# Patient Record
Sex: Female | Born: 1980
Health system: Southern US, Community
[De-identification: ages and names within clinical notes are randomized; demographics above are authoritative.]

## PROBLEM LIST (undated history)

## (undated) DIAGNOSIS — G4730 Sleep apnea, unspecified: Secondary | ICD-10-CM

## (undated) DIAGNOSIS — M5126 Other intervertebral disc displacement, lumbar region: Secondary | ICD-10-CM

## (undated) DIAGNOSIS — M1990 Unspecified osteoarthritis, unspecified site: Secondary | ICD-10-CM

## (undated) DIAGNOSIS — Z9889 Other specified postprocedural states: Secondary | ICD-10-CM

## (undated) DIAGNOSIS — C539 Malignant neoplasm of cervix uteri, unspecified: Secondary | ICD-10-CM

## (undated) DIAGNOSIS — F32A Depression, unspecified: Secondary | ICD-10-CM

## (undated) DIAGNOSIS — R011 Cardiac murmur, unspecified: Secondary | ICD-10-CM

## (undated) DIAGNOSIS — M5430 Sciatica, unspecified side: Secondary | ICD-10-CM

## (undated) DIAGNOSIS — J45909 Unspecified asthma, uncomplicated: Secondary | ICD-10-CM

## (undated) DIAGNOSIS — T8859XA Other complications of anesthesia, initial encounter: Secondary | ICD-10-CM

## (undated) DIAGNOSIS — C801 Malignant (primary) neoplasm, unspecified: Secondary | ICD-10-CM

## (undated) DIAGNOSIS — K219 Gastro-esophageal reflux disease without esophagitis: Secondary | ICD-10-CM

## (undated) DIAGNOSIS — J189 Pneumonia, unspecified organism: Secondary | ICD-10-CM

## (undated) DIAGNOSIS — E039 Hypothyroidism, unspecified: Secondary | ICD-10-CM

## (undated) DIAGNOSIS — F4310 Post-traumatic stress disorder, unspecified: Secondary | ICD-10-CM

## (undated) DIAGNOSIS — F419 Anxiety disorder, unspecified: Secondary | ICD-10-CM

## (undated) DIAGNOSIS — I959 Hypotension, unspecified: Secondary | ICD-10-CM

## (undated) DIAGNOSIS — E119 Type 2 diabetes mellitus without complications: Secondary | ICD-10-CM

## (undated) DIAGNOSIS — F329 Major depressive disorder, single episode, unspecified: Secondary | ICD-10-CM

## (undated) DIAGNOSIS — E079 Disorder of thyroid, unspecified: Secondary | ICD-10-CM

## (undated) HISTORY — DX: Hypothyroidism, unspecified: E03.9

## (undated) HISTORY — DX: Malignant neoplasm of cervix uteri, unspecified: C53.9

## (undated) HISTORY — PX: APPENDECTOMY: SHX54

## (undated) HISTORY — PX: OTHER SURGICAL HISTORY: SHX169

## (undated) HISTORY — PX: KNEE SURGERY: SHX244

## (undated) HISTORY — PX: CHOLECYSTECTOMY: SHX55

## (undated) HISTORY — PX: BACK SURGERY: SHX140

## (undated) HISTORY — PX: CARPAL TUNNEL RELEASE: SHX101

## (undated) HISTORY — PX: HIP SURGERY: SHX245

## (undated) HISTORY — PX: OOPHORECTOMY: SHX86

## (undated) HISTORY — PX: ABDOMINAL HYSTERECTOMY: SHX81

---

## 2017-02-17 ENCOUNTER — Other Ambulatory Visit: Payer: Self-pay | Admitting: Orthopedic Surgery

## 2017-02-17 DIAGNOSIS — M545 Low back pain: Principal | ICD-10-CM

## 2017-02-17 DIAGNOSIS — M79605 Pain in left leg: Secondary | ICD-10-CM

## 2017-02-17 DIAGNOSIS — G8929 Other chronic pain: Secondary | ICD-10-CM

## 2017-03-09 ENCOUNTER — Ambulatory Visit
Admission: RE | Admit: 2017-03-09 | Discharge: 2017-03-09 | Disposition: A | Discharge status: Home / Self Care | Payer: Worker's Compensation | Source: Ambulatory Visit | Attending: Orthopedic Surgery | Admitting: Orthopedic Surgery

## 2017-03-09 DIAGNOSIS — G8929 Other chronic pain: Secondary | ICD-10-CM

## 2017-03-09 DIAGNOSIS — M79605 Pain in left leg: Secondary | ICD-10-CM

## 2017-03-09 DIAGNOSIS — M545 Low back pain: Principal | ICD-10-CM

## 2017-04-15 ENCOUNTER — Encounter (HOSPITAL_COMMUNITY): Payer: Self-pay

## 2017-04-15 ENCOUNTER — Inpatient Hospital Stay (HOSPITAL_COMMUNITY)
Admission: AD | Admit: 2017-04-15 | Discharge: 2017-04-15 | Disposition: A | Discharge status: Home / Self Care | Payer: Federal, State, Local not specified - PPO | Source: Ambulatory Visit | Attending: Obstetrics & Gynecology | Admitting: Obstetrics & Gynecology

## 2017-04-15 DIAGNOSIS — J069 Acute upper respiratory infection, unspecified: Secondary | ICD-10-CM | POA: Diagnosis not present

## 2017-04-15 DIAGNOSIS — Z3202 Encounter for pregnancy test, result negative: Secondary | ICD-10-CM | POA: Insufficient documentation

## 2017-04-15 DIAGNOSIS — R21 Rash and other nonspecific skin eruption: Secondary | ICD-10-CM | POA: Diagnosis present

## 2017-04-15 DIAGNOSIS — J029 Acute pharyngitis, unspecified: Secondary | ICD-10-CM | POA: Diagnosis present

## 2017-04-15 HISTORY — DX: Anxiety disorder, unspecified: F41.9

## 2017-04-15 HISTORY — DX: Depression, unspecified: F32.A

## 2017-04-15 HISTORY — DX: Major depressive disorder, single episode, unspecified: F32.9

## 2017-04-15 HISTORY — DX: Other intervertebral disc displacement, lumbar region: M51.26

## 2017-04-15 HISTORY — DX: Post-traumatic stress disorder, unspecified: F43.10

## 2017-04-15 HISTORY — DX: Other specified postprocedural states: Z98.890

## 2017-04-15 HISTORY — DX: Unspecified asthma, uncomplicated: J45.909

## 2017-04-15 HISTORY — DX: Sleep apnea, unspecified: G47.30

## 2017-04-15 HISTORY — DX: Gastro-esophageal reflux disease without esophagitis: K21.9

## 2017-04-15 HISTORY — DX: Unspecified osteoarthritis, unspecified site: M19.90

## 2017-04-15 HISTORY — DX: Hypotension, unspecified: I95.9

## 2017-04-15 HISTORY — DX: Sciatica, unspecified side: M54.30

## 2017-04-15 LAB — URINALYSIS, ROUTINE W REFLEX MICROSCOPIC
Bacteria, UA: NONE SEEN
Glucose, UA: NEGATIVE mg/dL
Ketones, ur: 5 mg/dL — AB
Leukocytes, UA: NEGATIVE
Nitrite: NEGATIVE
Protein, ur: NEGATIVE mg/dL
Specific Gravity, Urine: 1.027 (ref 1.005–1.030)
pH: 5 (ref 5.0–8.0)

## 2017-04-15 LAB — POCT PREGNANCY, URINE: Preg Test, Ur: NEGATIVE

## 2017-04-15 LAB — INFLUENZA PANEL BY PCR (TYPE A & B)
Influenza A By PCR: NEGATIVE
Influenza B By PCR: NEGATIVE

## 2017-04-15 MED ORDER — AZITHROMYCIN 250 MG PO TABS: ORAL_TABLET | ORAL | 0 refills | Status: DC

## 2017-04-15 MED ORDER — HYDROCOD POLST-CPM POLST ER 10-8 MG/5ML PO SUER: 5.0000 mL | Freq: Two times a day (BID) | ORAL | 0 refills | Status: DC

## 2017-04-15 NOTE — Discharge Instructions (Signed)
In late 2019, the [facility] will be [facility] to the [facility]. At that time, the MAU (Maternity Admissions Unit), where you are being seen today, will no longer take care of non-pregnant patients. We strongly encourage you to find a doctor's office before that time, so that you can be seen with any GYN concerns, like vaginal discharge, urinary tract infection, etc.. in a timely manner. ° °In order to make an office visit more convenient, the [facility] will be offering evening hours with same-day appointments, walk-in appointments and scheduled appointments available during this time. ° °[facility] Hours: °Monday - 8am - 7:30 pm with walk-in between 4pm- 7:30 pm °Tuesday - 8 am - 5 pm (starting 05/17/17 we will be open late and accepting walk-ins from 4pm - 7:30pm) °Wednesday - 8 am - 5 pm (starting 08/17/17 we will be open late and accepting walk-ins from 4pm - 7:30pm) °Thursday 8 am - 5 pm (starting 11/17/17 we will be open late and accepting walk-ins from 4pm - 7:30pm) °Friday 8 am - 5 pm ° °For an appointment please call the [facility] at [phone] ° °For urgent needs, [facility] is also available for management of urgent GYN complaints such as vaginal discharge or urinary tract infections. ° ° ° ° ° °

## 2017-04-15 NOTE — MAU Note (Addendum)
Patient presents with fever last night of 104, cough, congestion, sore throat, shortness of breath.   Took Tylenol and Nyquil around 11 pm, uses a CPAP machine.

## 2017-04-15 NOTE — MAU Provider Note (Signed)
History     CSN: [id number]  Arrival date and time: 04/15/17 2725   First Provider Initiated Contact with Patient 04/15/17 [phone]      Chief Complaint  Patient presents with  . Fever  . Cough  . Sore Throat   HPI  Ms.  Frances Davis is a 37 y.o. year old [id number] non-pregnant female who presents to MAU reporting fever of 104 degree last night, cough, congestion, sore throat, and SOB. She took Tylenol and Nyquil around 11 pm, she uses a CPAP machine. She came to MAU, because "she is new to the area and didn't know where to go."   Past Medical History:  Diagnosis Date  . Anxiety   . Arthritis   . Asthma   . Depression   . GERD (gastroesophageal reflux disease)   . History of carpal tunnel surgery of right wrist   . History of carpal tunnel surgery of right wrist   . Low blood pressure   . Lumbar herniated disc   . PTSD (post-traumatic stress disorder)   . Sciatica   . Sleep apnea     Past Surgical History:  Procedure Laterality Date  . ABDOMINAL HYSTERECTOMY    . APPENDECTOMY    . cholesectomy    . KNEE SURGERY    . OOPHORECTOMY      History reviewed. No pertinent family history.  Social History   Tobacco Use  . Smoking status: Never Smoker  . Smokeless tobacco: Never Used  Substance Use Topics  . Alcohol use: No    Frequency: Never  . Drug use: No    Allergies: Not on File  No medications prior to admission.    Review of Systems  Constitutional: Negative.  Negative for fever (temp of 104 last night).  HENT: Positive for congestion and sore throat.   Eyes: Negative.   Respiratory: Positive for cough and shortness of breath.   Cardiovascular: Negative.   Gastrointestinal: Negative.   Endocrine: Negative.   Genitourinary: Negative.   Musculoskeletal: Negative.   Skin: Negative.   Allergic/Immunologic: Negative.   Neurological: Negative.   Hematological: Negative.   Psychiatric/Behavioral: Negative.    Physical Exam   Blood pressure 128/68,  pulse 94, temperature 98.4 F (36.9 C), resp. rate 18, height 5' (1.524 m), weight 196 lb (88.9 kg), SpO2 99 %.  Physical Exam  Nursing note and vitals reviewed. Constitutional: She is oriented to person, place, and time. She appears well-developed and well-nourished.  HENT:  Head: Normocephalic and atraumatic.  Eyes: Pupils are equal, round, and reactive to light.  Neck: Normal range of motion.  Cardiovascular: Normal rate, regular rhythm and normal heart sounds.  Respiratory: Effort normal and breath sounds normal.  GI: Soft. Bowel sounds are normal.  Genitourinary:  Genitourinary Comments: Pelvic not indicated  Musculoskeletal: Normal range of motion.  Neurological: She is alert and oriented to person, place, and time.  Skin: Skin is warm and dry. Rash (in groin) noted.  Psychiatric: She has a normal mood and affect. Her behavior is normal. Thought content normal.    MAU Course  Procedures  MDM CCUA Flu Swab Results for orders placed or performed during the hospital encounter of 04/15/17 (from the past 24 hour(s))  Urinalysis, Routine w reflex microscopic     Status: Abnormal   Collection Time: 04/15/17  8:10 AM  Result Value Ref Range   Color, Urine YELLOW YELLOW   APPearance HAZY (A) CLEAR   Specific Gravity, Urine 1.027 1.005 - 1.030  pH 5.0 5.0 - 8.0   Glucose, UA NEGATIVE NEGATIVE mg/dL   Hgb urine dipstick SMALL (A) NEGATIVE   Bilirubin Urine SMALL (A) NEGATIVE   Ketones, ur 5 (A) NEGATIVE mg/dL   Protein, ur NEGATIVE NEGATIVE mg/dL   Nitrite NEGATIVE NEGATIVE   Leukocytes, UA NEGATIVE NEGATIVE   RBC / HPF 0-5 0 - 5 RBC/hpf   WBC, UA 0-5 0 - 5 WBC/hpf   Bacteria, UA NONE SEEN NONE SEEN   Squamous Epithelial / LPF 0-5 (A) NONE SEEN   Mucus PRESENT   Pregnancy, urine POC     Status: None   Collection Time: 04/15/17  8:18 AM  Result Value Ref Range   Preg Test, Ur NEGATIVE NEGATIVE  Influenza panel by PCR (type A & B)     Status: None   Collection Time:  04/15/17  8:25 AM  Result Value Ref Range   Influenza A By PCR NEGATIVE NEGATIVE   Influenza B By PCR NEGATIVE NEGATIVE     Assessment and Plan  Upper respiratory tract infection, unspecified type  - Rx for Tussionex BID and Azithromax pak - Information provided on cough & URI - Discharge patient - Patient verbalized an understanding of the plan of care and agrees.     Laury Deep, MSN, CNM 04/15/2017, 8:43 AM

## 2018-02-11 ENCOUNTER — Emergency Department (HOSPITAL_COMMUNITY)
Admission: EM | Admit: 2018-02-11 | Discharge: 2018-02-12 | Disposition: A | Discharge status: Home / Self Care | Payer: Federal, State, Local not specified - PPO | Attending: Emergency Medicine | Admitting: Emergency Medicine

## 2018-02-11 ENCOUNTER — Emergency Department (HOSPITAL_COMMUNITY): Payer: Federal, State, Local not specified - PPO

## 2018-02-11 ENCOUNTER — Other Ambulatory Visit: Payer: Self-pay

## 2018-02-11 DIAGNOSIS — Z79899 Other long term (current) drug therapy: Secondary | ICD-10-CM | POA: Diagnosis not present

## 2018-02-11 DIAGNOSIS — R072 Precordial pain: Secondary | ICD-10-CM | POA: Diagnosis not present

## 2018-02-11 DIAGNOSIS — R079 Chest pain, unspecified: Secondary | ICD-10-CM | POA: Diagnosis present

## 2018-02-11 DIAGNOSIS — J45909 Unspecified asthma, uncomplicated: Secondary | ICD-10-CM | POA: Insufficient documentation

## 2018-02-11 LAB — CBC
HCT: 38.2 % (ref 36.0–46.0)
Hemoglobin: 12.8 g/dL (ref 12.0–15.0)
MCH: 30 pg (ref 26.0–34.0)
MCHC: 33.5 g/dL (ref 30.0–36.0)
MCV: 89.7 fL (ref 80.0–100.0)
Platelets: 292 10*3/uL (ref 150–400)
RBC: 4.26 MIL/uL (ref 3.87–5.11)
RDW: 12.2 % (ref 11.5–15.5)
WBC: 10 10*3/uL (ref 4.0–10.5)
nRBC: 0 % (ref 0.0–0.2)

## 2018-02-11 LAB — BASIC METABOLIC PANEL
Anion gap: 10 (ref 5–15)
BUN: 22 mg/dL — ABNORMAL HIGH (ref 6–20)
CO2: 25 mmol/L (ref 22–32)
Calcium: 8.7 mg/dL — ABNORMAL LOW (ref 8.9–10.3)
Chloride: 104 mmol/L (ref 98–111)
Creatinine, Ser: 0.8 mg/dL (ref 0.44–1.00)
GFR calc Af Amer: >60 mL/min (ref >60)
GFR calc non Af Amer: >60 mL/min (ref >60)
Glucose, Bld: 122 mg/dL — ABNORMAL HIGH (ref 70–99)
Potassium: 3.8 mmol/L (ref 3.5–5.1)
Sodium: 139 mmol/L (ref 135–145)

## 2018-02-11 LAB — I-STAT TROPONIN, ED
Troponin i, poc: 0 ng/mL (ref 0.00–0.08)
Troponin i, poc: 0 ng/mL (ref 0.00–0.08)

## 2018-02-11 MED ORDER — ALUM & MAG HYDROXIDE-SIMETH 200-200-20 MG/5ML PO SUSP
30.0000 mL | Freq: Once | ORAL | Status: AC
  Administered 2018-02-11: 30 mL via ORAL
  Filled 2018-02-11: qty 30

## 2018-02-11 MED ORDER — ACETAMINOPHEN 325 MG PO TABS
650.0000 mg | ORAL_TABLET | Freq: Once | ORAL | Status: AC
  Administered 2018-02-11: 650 mg via ORAL
  Filled 2018-02-11: qty 2

## 2018-02-11 MED ORDER — IBUPROFEN 200 MG PO TABS
600.0000 mg | ORAL_TABLET | Freq: Once | ORAL | Status: AC
  Administered 2018-02-11: 600 mg via ORAL
  Filled 2018-02-11: qty 1

## 2018-02-11 NOTE — ED Triage Notes (Signed)
Patient c/o CP that began this a.m. Took 324mg  asa. Also c/o SOB. C/o pain with inspiration.

## 2018-02-12 NOTE — ED Notes (Signed)
PT states understanding of care given, follow up care. PT ambulated from ED to car with a steady gait.  

## 2018-02-12 NOTE — ED Provider Notes (Signed)
[facility] EMERGENCY DEPARTMENT Provider Note   CSN: [id number] Arrival date & time: 02/11/18  1921     History   Chief Complaint Chief Complaint  Patient presents with  . Chest Pain    HPI Frances Davis is a 37 y.o. female.  HPI Patient is a 37 year old female with a family history of early cardiac disease who presents to the emergency department with complaints of anterior chest discomfort and posterior shoulder discomfort with some left arm discomfort that began around 1 PM today.  She took 2 aspirin.  She reported mild shortness of breath.  She has some pain with inspiration.  She denies tobacco abuse.  No history of hypertension, hyperlipidemia, diabetes.  She reports that her mother had MI x3 in her 61s.  Her brother has had an MI as well in his late 76s.  She has never had a cardiac work-up to this point.  Denies unilateral leg swelling.  Denies orthopnea.  Symptoms occurred while she was standing in the kitchen and cooking.  Her symptoms have been rather constant since 1 PM.  She does have some pleuritic posterior left shoulder and scapular discomfort.   Past Medical History:  Diagnosis Date  . Anxiety   . Arthritis   . Asthma   . Depression   . GERD (gastroesophageal reflux disease)   . History of carpal tunnel surgery of right wrist   . History of carpal tunnel surgery of right wrist   . Low blood pressure   . Lumbar herniated disc   . PTSD (post-traumatic stress disorder)   . Sciatica   . Sleep apnea     Patient Active Problem List   Diagnosis Date Noted  . Upper respiratory infection 04/15/2017  . Rash of genital area 04/15/2017    Past Surgical History:  Procedure Laterality Date  . ABDOMINAL HYSTERECTOMY    . APPENDECTOMY    . cholesectomy    . KNEE SURGERY    . OOPHORECTOMY       OB History    Gravida  2   Para  2   Term  2   Preterm      AB      Living  2     SAB      TAB      Ectopic      Multiple      Live Births  2            Home Medications    Prior to Admission medications   Medication Sig Start Date End Date Taking? Authorizing Provider  Calcium Carb-Cholecalciferol (CALCIUM 500 +D PO) Take 1 tablet by mouth daily.   Yes [provider]  celecoxib (CELEBREX) 200 MG capsule Take 200 mg by mouth daily.   Yes [provider]  diazepam (VALIUM) 5 MG tablet Take 5 mg by mouth 2 (two) times daily as needed for anxiety.   Yes [provider]  divalproex (DEPAKOTE) 500 MG DR tablet Take 500 mg by mouth daily.   Yes [provider]  esomeprazole (NEXIUM) 40 MG capsule Take 40 mg by mouth daily at 12 noon.   Yes [provider]  estradiol (ESTRACE) 1 MG tablet Take 1 mg by mouth daily.   Yes [provider]  hydrOXYzine (ATARAX/VISTARIL) 25 MG tablet Take 25 mg by mouth daily.   Yes [provider]  methocarbamol (ROBAXIN) 750 MG tablet Take 750 mg by mouth 2 (two) times daily.  Yes [provider]  Multiple Vitamin (MULTIVITAMIN WITH MINERALS) TABS tablet Take 1 tablet by mouth daily. Centrum   Yes [provider]  QUEtiapine (SEROQUEL) 100 MG tablet Take 100 mg by mouth at bedtime.   Yes [provider]  sertraline (ZOLOFT) 100 MG tablet Take 100 mg by mouth daily.   Yes [provider]  valACYclovir (VALTREX) 500 MG tablet Take 500 mg by mouth daily.   Yes [provider]    Family History No family history on file.  Social History Social History   Tobacco Use  . Smoking status: Never Smoker  . Smokeless tobacco: Never Used  Substance Use Topics  . Alcohol use: No    Frequency: Never  . Drug use: No     Allergies   Shellfish allergy and Lidocaine   Review of Systems Review of Systems  All other systems reviewed and are negative.    Physical Exam Updated Vital Signs BP 125/79   Pulse 83   Temp 98.2 F (36.8 C) (Oral)   Resp 20   Ht 5' (1.524 m)   Wt  89.8 kg   SpO2 99%   BMI 38.67 kg/m   Physical Exam Vitals signs and nursing note reviewed.  Constitutional:      General: She is not in acute distress.    Appearance: She is well-developed.  HENT:     Head: Normocephalic and atraumatic.  Neck:     Musculoskeletal: Normal range of motion.  Cardiovascular:     Rate and Rhythm: Normal rate and regular rhythm.     Heart sounds: Normal heart sounds.  Pulmonary:     Effort: Pulmonary effort is normal.     Breath sounds: Normal breath sounds.  Abdominal:     General: There is no distension.     Palpations: Abdomen is soft.     Tenderness: There is no abdominal tenderness.  Musculoskeletal: Normal range of motion.  Skin:    General: Skin is warm and dry.  Neurological:     Mental Status: She is alert and oriented to person, place, and time.  Psychiatric:        Judgment: Judgment normal.      ED Treatments / Results  Labs (all labs ordered are listed, but only abnormal results are displayed) Labs Reviewed  BASIC METABOLIC PANEL - Abnormal; Notable for the following components:      Result Value   Glucose, Bld 122 (*)    BUN 22 (*)    Calcium 8.7 (*)    All other components within normal limits  CBC  I-STAT TROPONIN, ED  I-STAT TROPONIN, ED    EKG EKG Interpretation  Date/Time:  Saturday February 11 2018 22:57:44 EST Ventricular Rate:  80 PR Interval:    QRS Duration: 89 QT Interval:  369 QTC Calculation: 426 R Axis:   74 Text Interpretation:  Sinus rhythm No significant change was found Confirmed by Jola Schmidt [phone]) on 02/12/2018 12:05:43 AM   Radiology Dg Chest 2 View  Result Date: 02/11/2018 CLINICAL DATA:  Chest pain starting this morning EXAM: CHEST - 2 VIEW COMPARISON:  None. FINDINGS: The heart size and mediastinal contours are within normal limits. Both lungs are clear. The visualized skeletal structures are unremarkable. Surgical clips are identified in the right upper quadrant. IMPRESSION: No  active cardiopulmonary disease. Electronically Signed   By: Abelardo Diesel M.D.   On: 02/11/2018 20:20    Procedures Procedures (including critical care time)  Medications  Ordered in ED Medications  ibuprofen (ADVIL,MOTRIN) tablet 600 mg (600 mg Oral Given 02/11/18 2258)  acetaminophen (TYLENOL) tablet 650 mg (650 mg Oral Given 02/11/18 2258)  alum & mag hydroxide-simeth (MAALOX/MYLANTA) 200-200-20 MG/5ML suspension 30 mL (30 mLs Oral Given 02/11/18 2259)     Initial Impression / Assessment and Plan / ED Course  I have reviewed the triage vital signs and the nursing notes.  Pertinent labs & imaging results that were available during my care of the patient were reviewed by me and considered in my medical decision making (see chart for details).     Vital signs are stable.  Low suspicion for ACS.  EKG x2 and troponin x2-.  Given her strong family history of early cardiac disease she will need outpatient cardiology follow-up.  Doubt dissection.  Doubt PE.  Patient is PERC negative.  No tachycardia or hypoxia.  Likely pleurisy.  Patient encouraged to return to the ER for new or worsening symptoms  Final Clinical Impressions(s) / ED Diagnoses   Final diagnoses:  Precordial chest pain    ED Discharge Orders    None       Jola Schmidt, MD 02/12/18 [phone]

## 2018-03-10 ENCOUNTER — Other Ambulatory Visit: Payer: Self-pay | Admitting: Surgery

## 2018-03-10 ENCOUNTER — Emergency Department (HOSPITAL_COMMUNITY)
Admission: EM | Admit: 2018-03-10 | Discharge: 2018-03-10 | Disposition: A | Discharge status: Home / Self Care | Payer: Federal, State, Local not specified - PPO | Attending: Emergency Medicine | Admitting: Emergency Medicine

## 2018-03-10 ENCOUNTER — Other Ambulatory Visit: Payer: Self-pay

## 2018-03-10 ENCOUNTER — Encounter (HOSPITAL_COMMUNITY): Payer: Self-pay

## 2018-03-10 DIAGNOSIS — R05 Cough: Secondary | ICD-10-CM | POA: Diagnosis present

## 2018-03-10 DIAGNOSIS — J069 Acute upper respiratory infection, unspecified: Secondary | ICD-10-CM

## 2018-03-10 DIAGNOSIS — J45909 Unspecified asthma, uncomplicated: Secondary | ICD-10-CM | POA: Diagnosis not present

## 2018-03-10 DIAGNOSIS — Z79899 Other long term (current) drug therapy: Secondary | ICD-10-CM | POA: Diagnosis not present

## 2018-03-10 DIAGNOSIS — Z9049 Acquired absence of other specified parts of digestive tract: Secondary | ICD-10-CM | POA: Diagnosis not present

## 2018-03-10 DIAGNOSIS — F419 Anxiety disorder, unspecified: Secondary | ICD-10-CM | POA: Insufficient documentation

## 2018-03-10 DIAGNOSIS — B9789 Other viral agents as the cause of diseases classified elsewhere: Secondary | ICD-10-CM

## 2018-03-10 DIAGNOSIS — F329 Major depressive disorder, single episode, unspecified: Secondary | ICD-10-CM | POA: Diagnosis not present

## 2018-03-10 LAB — INFLUENZA PANEL BY PCR (TYPE A & B)
Influenza A By PCR: NEGATIVE
Influenza B By PCR: NEGATIVE

## 2018-03-10 MED ORDER — BENZONATATE 100 MG PO CAPS: 100.0000 mg | ORAL_CAPSULE | Freq: Three times a day (TID) | ORAL | 0 refills | Status: DC

## 2018-03-10 MED ORDER — ACETAMINOPHEN 325 MG PO TABS: 650.0000 mg | ORAL_TABLET | Freq: Once | ORAL | Status: DC

## 2018-03-10 MED ORDER — IBUPROFEN 400 MG PO TABS
600.0000 mg | ORAL_TABLET | Freq: Once | ORAL | Status: DC
  Administered 2018-03-10: 600 mg via ORAL
  Filled 2018-03-10: qty 1

## 2018-03-10 MED ORDER — ALBUTEROL SULFATE HFA 108 (90 BASE) MCG/ACT IN AERS: 1.0000 | INHALATION_SPRAY | Freq: Four times a day (QID) | RESPIRATORY_TRACT | 0 refills | Status: DC | PRN

## 2018-03-10 NOTE — ED Notes (Signed)
Patient verbalizes understanding of discharge instructions. Opportunity for questioning and answers were provided. Armband removed by staff, pt discharged from ED.  

## 2018-03-10 NOTE — ED Triage Notes (Signed)
Pt presents for eval of cough, fever, and generalized body aches x2 days. Pt states [place] and was likely around several sick people. Pt states her temp at home has been above 100.26F, endorses wheezing. Pt states she has not taken any medication for her symptoms.

## 2018-03-10 NOTE — Discharge Instructions (Addendum)
At this time you appear to have a viral upper respiratory infection.  Treatment of symptoms is the best plan.  This includes tylenol or motrin for aches and pains, gargling with salt water for sore throat, increased fluid intake, especially hot drinks to soothe the throat.  Over the counter medications that include decongestants and cough suppressants may also help.  Expect to have symptoms for 5-10 days.  Return to the ER for worsening condition or new concerning symptoms.  1. Medications: tessalon for cough, ibuprofen/tylenol for body aches, albuterol for wheezing, usual home medications 2. Treatment: rest, drink plenty of fluids, take tylenol or ibuprofen for fever control 3. Follow Up: Please follow up with your primary doctor in 3 days for discussion of your diagnoses and further evaluation after today's visit; if you do not have a primary care doctor use the resource guide provided to find one; Return to the ER for high fevers, difficulty breathing or other concerning symptoms  Continue to stay well-hydrated. Gargle warm salt water and spit it out for sore throat. Take benadryl or other antihistamine to decrease secretions and for watery itchy eyes. Continued to alternate between Tylenol and ibuprofen for pain. Follow up with your primary care doctor in 5-7 days or referral for urgent care for recheck of ongoing symptoms however return to emergency department for emergent changing or worsening of symptoms.   Read the instructions below on reasons to return to the emergency department and to learn more about your diagnosis.  Use over the counter medications for symptomatic relief as we discussed (mucinex as a decongestant, Tylenol for fever/pain, Motrin/Ibuprofen for muscle aches). If prescribed a cough suppressant during your visit, do not operate heavy machinery with in 5 hours of taking this medication. Follow up with your primary care doctor in 4 days if your symptoms persist.  Your more than  welcome to return to the emergency department if symptoms worsen or become concerning.  Upper Respiratory Infection, Adult  An upper respiratory infection (URI) is also sometimes known as the common cold. Most people improve within 1 week, but symptoms can last up to 2 weeks. A residual cough may last even longer.   URI is most commonly caused by a virus. Viruses are NOT treated with antibiotics. You can easily spread the virus to others by oral contact. This includes kissing, sharing a glass, coughing, or sneezing. Touching your mouth or nose and then touching a surface, which is then touched by another person, can also spread the virus.   TREATMENT  Treatment is directed at relieving symptoms. There is no cure. Antibiotics are not effective, because the infection is caused by a virus, not by bacteria. Treatment may include:  Increased fluid intake. Sports drinks offer valuable electrolytes, sugars, and fluids.  Breathing heated mist or steam (vaporizer or shower).  Eating chicken soup or other clear broths, and maintaining good nutrition.  Getting plenty of rest.  Using gargles or lozenges for comfort.  Controlling fevers with ibuprofen or acetaminophen as directed by your caregiver.  Increasing usage of your inhaler if you have asthma.  Return to work when your temperature has returned to normal.   SEEK MEDICAL CARE IF:  After the first few days, you feel you are getting worse rather than better.  You develop worsening shortness of breath, or brown or red sputum. These may be signs of pneumonia.  You develop yellow or brown nasal discharge or pain in the face, especially when you bend forward. These may be  signs of sinusitis.  You develop a fever, swollen neck glands, pain with swallowing, or white areas in the back of your throat. These may be signs of strep throat.  Severe headache or stiff neck. Uncontrolled vomiting. Lightheadedness, feeling faint, or if you pass out. Problems  breathing or shortness of breath. Weakness or inability to walk. Any other concerning symptoms or concerns. If not improving over 7 days or if feeling worse at any time.  RESOURCE GUIDE  Dental Problems  Patients with Medicaid: [employer] Phone:  [phone]                                                                             Phone:  [phone]  If unable to pay or uninsured, contact:  [facility] or [facility]. to become qualified for the adult dental clinic.  Chronic Pain Problems Contact [facility]  [phone] Patients need to be referred by their primary care doctor.  Insufficient Money for Medicine Contact [employer]:  call "211" or [place] [phone].  No Primary Care Doctor Call [facility]  [phone] Other agencies that provide inexpensive medical care    [place]  [phone]    [facility] Internal [facility]  [place]  [phone]    [facility]  [phone]    [facility]  [place]  [phone] [facility]  [phone] [place]   [phone] (emergency services [phone])  Abuse/Neglect [place] [phone] [place] [phone] (After Hours)  Emergency [place] [phone]  [place] [facility] [place] [phone] [place] [phone]  MRSA Hotline #:   671-779-9524  [facility] Dept. [address]          [place]  [place] Phone:  [phone]                                     Phone:  [phone]                   Phone:  [place] Phone:  [place] [phone] (After Hours)   If you develop symptoms of Shortness of Breath, Chest Pain, Swelling of lips, mouth or tongue or if your condition becomes worse with any new symptoms, see your doctor or return to the Emergency Department for immediate care. Emergency services are not intended to be a substitute for comprehensive medical attention.  Please contact your doctor for follow up if not improving as expected.   Call your doctor in 5-7 days or as directed if there is no improvement.   Community Resources: *IF YOU ARE IN IMMEDIATE DANGER CALL 911!  Abuse/Neglect:  Family Services Crisis Hotline [facility]): ([phone] Center Against Violence [facility]): [phone]  After hours, holidays and weekends: 347-540-4691 National Domestic Violence Hotline: [place]: [place]: Dannielle Karvonen: 7075134085  Health Clinics:  Urgent [place] ([place]): [phone] Monday - Friday 8 AM - 9 PM, Saturday and Sunday 10 AM - 9 PM  [facility] [place]: [phone] Monday - Friday 8 AM - 5 PM  [facility]  [address]: [phone] Monday- Friday 8:30 AM - 5:30 PM, Sat 9 AM - 1 PM  24 HR [place] [facility] [address]: [phone] [facility] [address]: [phone] [facility] [address]: [phone]  24 [facility] [place] [facility]: [address] [phone]  Cultures: If culture results are positive, we will notify you if a change in treatment is necessary.  LABORATORY TESTS:         If  you had any labs drawn in the ED that have not resulted by the time you are discharged home, we will review these lab results and the treatment given to you.  If there is any further treatment or notification needed, we will contact you by phone, or letter.  "PLEASE ENSURE THAT YOU HAVE GIVEN US YOUR CURRENT WORKING PHONE NUMBER AND YOUR CURRENT ADDRESS, so that we can contact you if needed."  RADIOLOGY TESTS:  If the referred physician wants todays x-rays, please call the hospitals Radiology Department the day before your doctors appointment. [place]     [phone] [place]   [phone] [place]     [phone]  Our doctors and staff appreciate your choosing [place] for your emergency medical care needs. We are here to serve you.

## 2018-03-10 NOTE — ED Provider Notes (Signed)
[facility] Provider Note   CSN: [id number] Arrival date & time: 03/10/18  1138     History   Chief Complaint Chief Complaint  Patient presents with  . Cough  . Fever    HPI Frances Davis is a 38 y.o. female with a PMH of asthma presenting with a productive cough, fever, body aches, and congestion onset 2 days ago. Patient reports she went to [employer] and has had multiple sick contacts. Patient states she has taken Nyquil without relief. Patient reports associated sore throat, frontal headaches, and wheezing. Patient reports shortness of breath with coughing at times. Patient states she had a fever yesterday of 100F at home. Patient denies nausea, vomiting, diarrhea, abdominal pain, chest pain, weakness, vision changes, dizziness, or syncope.   HPI  Past Medical History:  Diagnosis Date  . Anxiety   . Arthritis   . Asthma   . Depression   . GERD (gastroesophageal reflux disease)   . History of carpal tunnel surgery of right wrist   . History of carpal tunnel surgery of right wrist   . Low blood pressure   . Lumbar herniated disc   . PTSD (post-traumatic stress disorder)   . Sciatica   . Sleep apnea     Patient Active Problem List   Diagnosis Date Noted  . Upper respiratory infection 04/15/2017  . Rash of genital area 04/15/2017    Past Surgical History:  Procedure Laterality Date  . ABDOMINAL HYSTERECTOMY    . APPENDECTOMY    . cholesectomy    . KNEE SURGERY    . OOPHORECTOMY       OB History    Gravida  2   Para  2   Term  2   Preterm      AB      Living  2     SAB      TAB      Ectopic      Multiple      Live Births  2            Home Medications    Prior to Admission medications   Medication Sig Start Date End Date Taking? Authorizing Provider  albuterol (PROVENTIL HFA;VENTOLIN HFA) 108 (90 Base) MCG/ACT inhaler Inhale 1-2 puffs into the lungs every 6 (six) hours as needed for  wheezing or shortness of breath. 03/10/18   Jerilee Hoh, Jude Linck P, PA-C  benzonatate (TESSALON) 100 MG capsule Take 1 capsule (100 mg total) by mouth every 8 (eight) hours. 03/10/18   Darlin Drop P, PA-C  Calcium Carb-Cholecalciferol (CALCIUM 500 +D PO) Take 1 tablet by mouth daily.    [provider]  celecoxib (CELEBREX) 200 MG capsule Take 200 mg by mouth daily.    [provider]  diazepam (VALIUM) 5 MG tablet Take 5 mg by mouth 2 (two) times daily as needed for anxiety.    [provider]  divalproex (DEPAKOTE) 500 MG DR tablet Take 500 mg by mouth daily.    [provider]  esomeprazole (NEXIUM) 40 MG capsule Take 40 mg by mouth daily at 12 noon.    [provider]  estradiol (ESTRACE) 1 MG tablet Take 1 mg by mouth daily.    [provider]  hydrOXYzine (ATARAX/VISTARIL) 25 MG tablet Take 25 mg by mouth daily.    [provider]  methocarbamol (ROBAXIN) 750 MG tablet Take 750 mg by mouth 2 (two) times daily.    [provider]  Multiple Vitamin (MULTIVITAMIN WITH MINERALS) TABS tablet Take 1 tablet by mouth daily. Centrum    [provider]  QUEtiapine (SEROQUEL) 100 MG tablet Take 100 mg by mouth at bedtime.    [provider]  sertraline (ZOLOFT) 100 MG tablet Take 100 mg by mouth daily.    [provider]  valACYclovir (VALTREX) 500 MG tablet Take 500 mg by mouth daily.    [provider]    Family History No family history on file.  Social History Social History   Tobacco Use  . Smoking status: Never Smoker  . Smokeless tobacco: Never Used  Substance Use Topics  . Alcohol use: No    Frequency: Never  . Drug use: No     Allergies   Shellfish allergy and Lidocaine   Review of Systems Review of Systems  Constitutional: Positive for appetite change, fatigue and fever. Negative for activity change.  HENT: Positive for congestion, rhinorrhea, sinus pressure, sneezing and  sore throat. Negative for ear pain, postnasal drip, trouble swallowing and voice change.   Eyes: Negative for photophobia, pain, discharge, redness, itching and visual disturbance.  Respiratory: Positive for cough and shortness of breath.   Cardiovascular: Negative for chest pain.  Gastrointestinal: Negative for abdominal pain, diarrhea, nausea and vomiting.  Endocrine: Negative for cold intolerance and heat intolerance.  Genitourinary: Negative for dysuria.  Musculoskeletal: Positive for myalgias. Negative for arthralgias, gait problem and neck pain.  Skin: Negative for rash.  Allergic/Immunologic: Negative for environmental allergies.  Neurological: Positive for headaches. Negative for dizziness, syncope and weakness.     Physical Exam Updated Vital Signs BP 111/62 (BP Location: Left Arm)   Pulse 92   Temp 99.1 F (37.3 C) (Oral)   Resp 18   Ht 5' (1.524 m)   Wt 91.6 kg   SpO2 95%   BMI 39.45 kg/m   Physical Exam Vitals signs and nursing note reviewed.  Constitutional:      General: She is not in acute distress.    Appearance: She is well-developed. She is not diaphoretic.  HENT:     Head: Normocephalic and atraumatic.     Right Ear: Tympanic membrane, ear canal and external ear normal. No middle ear effusion.     Left Ear: Tympanic membrane, ear canal and external ear normal.  No middle ear effusion.     Nose: Mucosal edema and rhinorrhea present.     Mouth/Throat:     Pharynx: Uvula midline. No oropharyngeal exudate or posterior oropharyngeal erythema.  Eyes:     General:        Right eye: No discharge.        Left eye: No discharge.     Extraocular Movements: Extraocular movements intact.     Conjunctiva/sclera: Conjunctivae normal.     Pupils: Pupils are equal, round, and reactive to light.  Neck:     Musculoskeletal: Normal range of motion and neck supple.  Cardiovascular:     Rate and Rhythm: Normal rate and regular rhythm.     Heart sounds: Normal heart  sounds. No murmur. No friction rub. No gallop.   Pulmonary:     Effort: Pulmonary effort is normal. No respiratory distress.     Breath sounds: Normal breath sounds. No wheezing or rales.  Abdominal:     Palpations: Abdomen is soft.     Tenderness: There is no abdominal tenderness.  Musculoskeletal: Normal range of motion.  Lymphadenopathy:     Cervical: No cervical  adenopathy.  Skin:    General: Skin is warm.     Findings: No rash.  Neurological:     General: No focal deficit present.     Mental Status: She is alert and oriented to person, place, and time.     Cranial Nerves: No cranial nerve deficit.     Sensory: No sensory deficit.     Motor: No weakness.     Coordination: Coordination normal.     Gait: Gait normal.     ED Treatments / Results  Labs (all labs ordered are listed, but only abnormal results are displayed) Labs Reviewed  INFLUENZA PANEL BY PCR (TYPE A & B)    EKG None  Radiology No results found.  Procedures Procedures (including critical care time)  Medications Ordered in ED Medications - No data to display   Initial Impression / Assessment and Plan / ED Course  I have reviewed the triage vital signs and the nursing notes.  Pertinent labs & imaging results that were available during my care of the patient were reviewed by me and considered in my medical decision making (see chart for details).    Patients symptoms are consistent with URI, likely viral etiology. Discussed that antibiotics are not indicated for viral infections. Influenza test is negative. Pt will be discharged with symptomatic treatment.  Verbalizes understanding and is agreeable with plan. Pt is hemodynamically stable & in NAD prior to dc.  Final Clinical Impressions(s) / ED Diagnoses   Final diagnoses:  Viral URI with cough    ED Discharge Orders         Ordered    albuterol (PROVENTIL HFA;VENTOLIN HFA) 108 (90 Base) MCG/ACT inhaler  Every 6 hours PRN     03/10/18 1409     benzonatate (TESSALON) 100 MG capsule  Every 8 hours     03/10/18 1409           Arville Lime, PA-C 03/10/18 [place], Julie, MD 03/10/18 1417

## 2018-03-13 ENCOUNTER — Other Ambulatory Visit: Payer: Self-pay | Admitting: Surgery

## 2018-03-13 DIAGNOSIS — R2233 Localized swelling, mass and lump, upper limb, bilateral: Secondary | ICD-10-CM

## 2018-03-28 ENCOUNTER — Other Ambulatory Visit: Payer: Self-pay | Admitting: Internal Medicine

## 2018-03-28 ENCOUNTER — Other Ambulatory Visit: Payer: Self-pay

## 2018-03-28 ENCOUNTER — Other Ambulatory Visit: Payer: Self-pay | Admitting: Surgery

## 2018-03-28 DIAGNOSIS — E0590 Thyrotoxicosis, unspecified without thyrotoxic crisis or storm: Secondary | ICD-10-CM

## 2018-03-28 DIAGNOSIS — R2233 Localized swelling, mass and lump, upper limb, bilateral: Secondary | ICD-10-CM

## 2018-03-30 ENCOUNTER — Ambulatory Visit
Admission: RE | Admit: 2018-03-30 | Discharge: 2018-03-30 | Disposition: A | Discharge status: Home / Self Care | Payer: Federal, State, Local not specified - PPO | Source: Ambulatory Visit | Attending: Surgery | Admitting: Surgery

## 2018-03-30 DIAGNOSIS — R2233 Localized swelling, mass and lump, upper limb, bilateral: Secondary | ICD-10-CM

## 2018-03-31 ENCOUNTER — Ambulatory Visit
Admission: RE | Admit: 2018-03-31 | Discharge: 2018-03-31 | Disposition: A | Discharge status: Home / Self Care | Payer: Federal, State, Local not specified - PPO | Source: Ambulatory Visit | Attending: Internal Medicine | Admitting: Internal Medicine

## 2018-03-31 DIAGNOSIS — E0590 Thyrotoxicosis, unspecified without thyrotoxic crisis or storm: Secondary | ICD-10-CM

## 2018-09-18 ENCOUNTER — Encounter (HOSPITAL_COMMUNITY): Payer: Self-pay | Admitting: *Deleted

## 2018-09-18 ENCOUNTER — Emergency Department (HOSPITAL_COMMUNITY)
Admission: EM | Admit: 2018-09-18 | Discharge: 2018-09-18 | Disposition: A | Discharge status: Home / Self Care | Payer: Federal, State, Local not specified - PPO | Attending: Emergency Medicine | Admitting: Emergency Medicine

## 2018-09-18 ENCOUNTER — Emergency Department (HOSPITAL_COMMUNITY): Payer: Federal, State, Local not specified - PPO

## 2018-09-18 ENCOUNTER — Other Ambulatory Visit: Payer: Self-pay

## 2018-09-18 DIAGNOSIS — Z8541 Personal history of malignant neoplasm of cervix uteri: Secondary | ICD-10-CM | POA: Insufficient documentation

## 2018-09-18 DIAGNOSIS — E039 Hypothyroidism, unspecified: Secondary | ICD-10-CM | POA: Diagnosis not present

## 2018-09-18 DIAGNOSIS — J45909 Unspecified asthma, uncomplicated: Secondary | ICD-10-CM | POA: Diagnosis not present

## 2018-09-18 DIAGNOSIS — R0789 Other chest pain: Secondary | ICD-10-CM

## 2018-09-18 HISTORY — DX: Disorder of thyroid, unspecified: E07.9

## 2018-09-18 HISTORY — DX: Malignant (primary) neoplasm, unspecified: C80.1

## 2018-09-18 LAB — COMPREHENSIVE METABOLIC PANEL
ALT: 19 U/L (ref 0–44)
AST: 17 U/L (ref 15–41)
Albumin: 3.5 g/dL (ref 3.5–5.0)
Alkaline Phosphatase: 47 U/L (ref 38–126)
Anion gap: 9 (ref 5–15)
BUN: 11 mg/dL (ref 6–20)
CO2: 26 mmol/L (ref 22–32)
Calcium: 8.6 mg/dL — ABNORMAL LOW (ref 8.9–10.3)
Chloride: 104 mmol/L (ref 98–111)
Creatinine, Ser: 0.77 mg/dL (ref 0.44–1.00)
GFR calc Af Amer: >60 mL/min (ref >60)
GFR calc non Af Amer: >60 mL/min (ref >60)
Glucose, Bld: 87 mg/dL (ref 70–99)
Potassium: 3.9 mmol/L (ref 3.5–5.1)
Sodium: 139 mmol/L (ref 135–145)
Total Bilirubin: 0.4 mg/dL (ref 0.3–1.2)
Total Protein: 7.3 g/dL (ref 6.5–8.1)

## 2018-09-18 LAB — CBC
HCT: 39.8 % (ref 36.0–46.0)
Hemoglobin: 13.1 g/dL (ref 12.0–15.0)
MCH: 30.1 pg (ref 26.0–34.0)
MCHC: 32.9 g/dL (ref 30.0–36.0)
MCV: 91.5 fL (ref 80.0–100.0)
Platelets: 299 10*3/uL (ref 150–400)
RBC: 4.35 MIL/uL (ref 3.87–5.11)
RDW: 11.7 % (ref 11.5–15.5)
WBC: 8.7 10*3/uL (ref 4.0–10.5)
nRBC: 0 % (ref 0.0–0.2)

## 2018-09-18 LAB — TROPONIN I (HIGH SENSITIVITY): Troponin I (High Sensitivity): 2 ng/L (ref <18)

## 2018-09-18 LAB — D-DIMER, QUANTITATIVE: D-Dimer, Quant: 0.43 ug/mL-FEU (ref 0.00–0.50)

## 2018-09-18 LAB — LIPASE, BLOOD: Lipase: 29 U/L (ref 11–51)

## 2018-09-18 MED ORDER — KETOROLAC TROMETHAMINE 30 MG/ML IJ SOLN
30.0000 mg | Freq: Once | INTRAMUSCULAR | Status: AC
  Administered 2018-09-18: 16:00:00 30 mg via INTRAVENOUS
  Filled 2018-09-18: qty 1

## 2018-09-18 MED ORDER — SODIUM CHLORIDE 0.9% FLUSH
3.0000 mL | Freq: Once | INTRAVENOUS | Status: AC
  Administered 2018-09-18: 3 mL via INTRAVENOUS

## 2018-09-18 NOTE — ED Provider Notes (Signed)
[facility] Provider Note   CSN: [id number] Arrival date & time: 09/18/18  1420    History   Chief Complaint Chief Complaint  Patient presents with  . Chest Pain  . Shoulder Pain    HPI Frances Davis is a 38 y.o. female presenting to the ED complaining of right-sided chest pain that started last night.  Patient reports the pain is sharp in nature, 10 out of 10 in severity, and is worse with movements and deep breaths.  She reports the pain is located on her right upper chest and radiates to her right shoulder.  She reports associated shortness of breath due to her pain.  She reports having similar symptoms in the past for which she was diagnosed with pleurisy.  Patient takes estradiol.  She has a history of early cardiac disease in her family.  She denies any recent illness, fever, chills, cough, nausea, vomiting, diarrhea, or other complaints.     The history is provided by the patient.    Past Medical History:  Diagnosis Date  . Anxiety   . Arthritis   . Asthma   . Cancer (HCC)    cervical  . Depression   . GERD (gastroesophageal reflux disease)   . History of carpal tunnel surgery of right wrist   . History of carpal tunnel surgery of right wrist   . Low blood pressure   . Lumbar herniated disc   . PTSD (post-traumatic stress disorder)   . Sciatica   . Sleep apnea   . Thyroid disease    hypothyroidism    Patient Active Problem List   Diagnosis Date Noted  . Upper respiratory infection 04/15/2017  . Rash of genital area 04/15/2017    Past Surgical History:  Procedure Laterality Date  . ABDOMINAL HYSTERECTOMY    . APPENDECTOMY    . CHOLECYSTECTOMY    . cholesectomy    . KNEE SURGERY    . OOPHORECTOMY       OB History    Gravida  2   Para  2   Term  2   Preterm      AB      Living  2     SAB      TAB      Ectopic      Multiple      Live Births  2            Home Medications    Prior to  Admission medications   Medication Sig Start Date End Date Taking? Authorizing Provider  albuterol (PROVENTIL HFA;VENTOLIN HFA) 108 (90 Base) MCG/ACT inhaler Inhale 1-2 puffs into the lungs every 6 (six) hours as needed for wheezing or shortness of breath. 03/10/18   Jerilee Hoh, Ana P, PA-C  benzonatate (TESSALON) 100 MG capsule Take 1 capsule (100 mg total) by mouth every 8 (eight) hours. 03/10/18   Darlin Drop P, PA-C  Calcium Carb-Cholecalciferol (CALCIUM 500 +D PO) Take 1 tablet by mouth daily.    [provider]  celecoxib (CELEBREX) 200 MG capsule Take 200 mg by mouth daily.    [provider]  diazepam (VALIUM) 5 MG tablet Take 5 mg by mouth 2 (two) times daily as needed for anxiety.    [provider]  divalproex (DEPAKOTE) 500 MG DR tablet Take 500 mg by mouth daily.    [provider]  esomeprazole (NEXIUM) 40 MG capsule Take 40 mg by mouth daily at 12 noon.  [provider]  estradiol (ESTRACE) 1 MG tablet Take 1 mg by mouth daily.    [provider]  hydrOXYzine (ATARAX/VISTARIL) 25 MG tablet Take 25 mg by mouth daily.    [provider]  methocarbamol (ROBAXIN) 750 MG tablet Take 750 mg by mouth 2 (two) times daily.    [provider]  Multiple Vitamin (MULTIVITAMIN WITH MINERALS) TABS tablet Take 1 tablet by mouth daily. Centrum    [provider]  QUEtiapine (SEROQUEL) 100 MG tablet Take 100 mg by mouth at bedtime.    [provider]  sertraline (ZOLOFT) 100 MG tablet Take 100 mg by mouth daily.    [provider]  valACYclovir (VALTREX) 500 MG tablet Take 500 mg by mouth daily.    [provider]    Family History No family history on file.  Social History Social History   Tobacco Use  . Smoking status: Never Smoker  . Smokeless tobacco: Never Used  Substance Use Topics  . Alcohol use: No    Frequency: Never  . Drug use: No     Allergies   Shellfish allergy  and Lidocaine   Review of Systems Review of Systems  Constitutional: Negative for chills and fever.  HENT: Negative for ear pain and sore throat.   Eyes: Negative for pain and visual disturbance.  Respiratory: Positive for shortness of breath. Negative for cough.   Cardiovascular: Positive for chest pain. Negative for palpitations.  Gastrointestinal: Negative for abdominal pain and vomiting.  Genitourinary: Negative for dysuria and hematuria.  Musculoskeletal: Negative for arthralgias and back pain.  Skin: Negative for color change and rash.  Neurological: Negative for seizures and syncope.  All other systems reviewed and are negative.    Physical Exam Updated Vital Signs BP 116/72   Pulse 90   Temp 98.1 F (36.7 C) (Oral)   Resp 15   Ht 5' (1.524 m)   Wt 88 kg   SpO2 100%   BMI 37.89 kg/m   Physical Exam Vitals signs and nursing note reviewed.  Constitutional:      General: She is not in acute distress.    Appearance: She is not ill-appearing, toxic-appearing or diaphoretic.  HENT:     Head: Normocephalic and atraumatic.  Eyes:     Extraocular Movements: Extraocular movements intact.     Conjunctiva/sclera: Conjunctivae normal.     Pupils: Pupils are equal, round, and reactive to light.  Neck:     Musculoskeletal: Normal range of motion and neck supple. No neck rigidity or muscular tenderness.  Cardiovascular:     Rate and Rhythm: Normal rate and regular rhythm.     Pulses: Normal pulses.     Heart sounds: Normal heart sounds. No murmur. No friction rub. No gallop.   Pulmonary:     Effort: Pulmonary effort is normal. No respiratory distress.     Breath sounds: Normal breath sounds. No stridor. No wheezing, rhonchi or rales.  Chest:     Chest wall: Tenderness (to right upper chest) present.  Abdominal:     General: Abdomen is flat. There is no distension.     Palpations: Abdomen is soft.     Tenderness: There is no abdominal tenderness. There is no guarding or  rebound.  Musculoskeletal: Normal range of motion.        General: No swelling, tenderness, deformity or signs of injury.     Comments: Chest pain is worsened with movement of the right shoulder  Skin:  General: Skin is warm and dry.  Neurological:     General: No focal deficit present.     Mental Status: She is alert and oriented to person, place, and time. Mental status is at baseline.     Cranial Nerves: No cranial nerve deficit.     Sensory: No sensory deficit.     Motor: No weakness.  Psychiatric:        Mood and Affect: Mood normal.        Behavior: Behavior normal.      ED Treatments / Results  Labs (all labs ordered are listed, but only abnormal results are displayed) Labs Reviewed  COMPREHENSIVE METABOLIC PANEL - Abnormal; Notable for the following components:      Result Value   Calcium 8.6 (*)    All other components within normal limits  CBC  LIPASE, BLOOD  D-DIMER, QUANTITATIVE (NOT AT [facility])  TROPONIN I (HIGH SENSITIVITY)    EKG EKG Interpretation  Date/Time:  Monday September 18 2018 14:30:12 EDT Ventricular Rate:  89 PR Interval:    QRS Duration: 114 QT Interval:  355 QTC Calculation: 432 R Axis:   72 Text Interpretation:  Sinus rhythm Borderline intraventricular conduction delay RSR' in V1 or V2, right VCD or RVH since last tracing no significant change Confirmed by Noemi Chapel [phone]) on 09/18/2018 2:52:02 PM   Radiology Dg Chest 2 View  Result Date: 09/18/2018 CLINICAL DATA:  Chest pain and shortness of breath EXAM: CHEST - 2 VIEW COMPARISON:  None. FINDINGS: Lungs are clear. Heart size and pulmonary vascularity are normal. No adenopathy. No bone lesions. No pneumothorax. IMPRESSION: No edema or consolidation. Electronically Signed   By: Lowella Grip III M.D.   On: 09/18/2018 15:06    Procedures Procedures (including critical care time)  Medications Ordered in ED Medications  sodium chloride flush (NS) 0.9 % injection 3 mL (has no  administration in time range)  ketorolac (TORADOL) 30 MG/ML injection 30 mg (has no administration in time range)     Initial Impression / Assessment and Plan / ED Course  I have reviewed the triage vital signs and the nursing notes.  Pertinent labs & imaging results that were available during my care of the patient were reviewed by me and considered in my medical decision making (see chart for details).        Shaketta Carre is a 38 y.o. female presenting to the ED complaining of right-sided chest pain that started last night and is worse with deep breaths and movement.  On exam, patient has reproducible chest wall tenderness to the right chest.  She is well-appearing and has normal vital signs.  Low concern for ACS at this time.  Given the pleuritic nature of patient's pain, associated shortness of breath, and hormone use, d-dimer was obtained to further stratify for PE.  D-dimer was below the cutoff for pulmonary embolism.  Chest x-ray was obtained and showed no signs of acute cardiopulmonary abnormalities.  CMP, CBC, and troponin were obtained and were unremarkable.  Patient's symptoms are likely secondary to pleurisy versus musculoskeletal chest wall pain given the patient's tenderness on exam and worsening of her pain with movement.  Patient was given a dose of Toradol in the ED and was discharged home with instructions to take anti-inflammatory medications for her pain.  She was advised to follow-up closely with her primary care physician and to return to the ED for any worsening of her symptoms.  Final Clinical Impressions(s) / ED Diagnoses  Final diagnoses:  Chest wall pain    ED Discharge Orders    None       Candie Chroman, MD 09/18/18 Marella Bile    Noemi Chapel, MD 09/19/18 2037

## 2018-09-18 NOTE — ED Triage Notes (Addendum)
PT states acute R sided chest pain that radiates to R shoulder/center of her back.  Describes pain as sharp.  Denies cough, but states difficult to take breath.  Denies nausea.  Pt took a vicodin last night and it didn't touch the pain.

## 2018-09-18 NOTE — ED Provider Notes (Signed)
The patient is a 38 year old female, she has denied any prior cardiac history, her risk factors for cardiac disease are low, she does take estrogen-containing medications and presents with a complaint of pleurisy which she described as a right-sided parasternal sharp chest pain which radiates to her scapula.  Worse with a deep breath worse with laying back, symptoms are mild at this time.  On exam she has clear heart and lung sounds, no distress, no reproducible tenderness, normal vital signs and an EKG which is totally normal.  Doubt ACS, unlikely to be pulmonary embolism but due to her big picture she will need a d-dimer.  CT scan if positive, home if negative on anti-inflammatories.  I saw and evaluated the patient, reviewed the resident's note and I agree with the findings and plan.   EKG Interpretation  Date/Time:  Monday September 18 2018 14:30:12 EDT Ventricular Rate:  89 PR Interval:    QRS Duration: 114 QT Interval:  355 QTC Calculation: 432 R Axis:   72 Text Interpretation:  Sinus rhythm Borderline intraventricular conduction delay RSR' in V1 or V2, right VCD or RVH since last tracing no significant change Confirmed by Noemi Chapel [phone]) on 09/18/2018 2:52:02 PM Also confirmed by Noemi Chapel [phone]), editor Hattie Perch ([id number])  on 09/19/2018 6:58:21 AM       Final diagnoses:  Chest wall pain      Noemi Chapel, MD 09/19/18 2037

## 2018-09-18 NOTE — ED Notes (Signed)
Patient verbalizes understanding of discharge instructions. Opportunity for questioning and answers were provided. Armband removed by staff, pt discharged from ED ambulatory to home.  

## 2018-09-26 ENCOUNTER — Ambulatory Visit (INDEPENDENT_AMBULATORY_CARE_PROVIDER_SITE_OTHER): Payer: Federal, State, Local not specified - PPO | Admitting: Critical Care Medicine

## 2018-09-26 ENCOUNTER — Other Ambulatory Visit: Payer: Self-pay

## 2018-09-26 ENCOUNTER — Encounter: Payer: Self-pay | Admitting: Critical Care Medicine

## 2018-09-26 VITALS — BP 110/78 | HR 80 | Temp 97.6°F | Ht 60.25 in | Wt 196.4 lb

## 2018-09-26 DIAGNOSIS — R0781 Pleurodynia: Secondary | ICD-10-CM

## 2018-09-26 DIAGNOSIS — J4530 Mild persistent asthma, uncomplicated: Secondary | ICD-10-CM

## 2018-09-26 DIAGNOSIS — G4733 Obstructive sleep apnea (adult) (pediatric): Secondary | ICD-10-CM

## 2018-09-26 DIAGNOSIS — Z9989 Dependence on other enabling machines and devices: Secondary | ICD-10-CM

## 2018-09-26 MED ORDER — ALBUTEROL SULFATE HFA 108 (90 BASE) MCG/ACT IN AERS: 1.0000 | INHALATION_SPRAY | Freq: Four times a day (QID) | RESPIRATORY_TRACT | 1 refills | Status: DC | PRN

## 2018-09-26 NOTE — Patient Instructions (Addendum)
Thank you for visiting Dr. Carlis Abbott at [facility]. We recommend the following: Orders Placed This Encounter  Procedures  . Pulmonary function test   Meds ordered this encounter  Medications  . albuterol (VENTOLIN HFA) 108 (90 Base) MCG/ACT inhaler    Sig: Inhale 1-2 puffs into the lungs every 6 (six) hours as needed for wheezing or shortness of breath.    Dispense:  6.7 g    Refill:  1   Return in about 2 months (around 11/26/2018).    Please do your part to reduce the spread of COVID-19.

## 2018-09-26 NOTE — Progress Notes (Addendum)
Synopsis: Referred in August 2020 for evaluation of pleuritic chest pain by Merrilee Seashore, MD.   Subjective:   PATIENT ID: Frances Davis GENDER: female DOB: October 30, 1980, MRN: [id number]  Chief Complaint  Patient presents with  . Consult    pleuritic chest pain    Ms. Manuele is a 38 year old woman with a history of sleep apnea and cervical cancer who was recently seen in the emergency department for right- sided parasternal sharp chest pain radiating to her scapula that was worsened with breathing.  At that time she had a negative d-dimer and was discharged home due to low concern for pulmonary embolus or ACS.  She was discharged on prednisone, which improved her symptoms somewhat.  She has had pain for little over a week.  It began after a coughing episode during bronchitis.  The pain is intermittent, but can last up to several days at a time.  She had an episode similar to this about 3 years ago that lasted for several months, on and off, but it had totally resolved for years.  She describes the pain as stabbing, 10/ 10 in severity that is worse with breathing, laying down, swallowing, sneezing, talking.  During these episodes her pain improves with standing up and walking around.  The pain starts in the right upper parasternal area, and radiates around her chest towards her scapula.  She never gets this pain on the left side.  She has no history of surgeries, trauma, or instrumentation of her right chest.  She does not have a rash.  This episode is more severe than her episode 3 years ago.  She has no history of VTE or cardiac disease.  She has never had rheumatologic disease, but her mother has rheumatoid arthritis.  She denies joint symptoms, including stiffness.  No fevers, chills, sweats, coughing.  There is no family history of pleurisy or pleural effusions.  She does not smoke or vape.  She reports a history of asthma managed with intermittent albuterol.  She was diagnosed with asthma  as a child, but has never required hospitalization for it.  She has had PFTs several years ago done in [place], but reports that physician was in solo practice and is no longer practicing.  She has never required maintenance medication for her asthma.  Her predominant symptoms with her asthma or shortness of breath and wheezing, which improved with albuterol. ACT 19.  She has a history of OSA managed with CPAP.  Her PSG was performed 3 years ago in [place].  She does not think that her CPAP is working well currently.  She has a DME company in [place] and would like to have her CPAP managed by me.    Past Medical History:  Diagnosis Date  . Anxiety   . Arthritis   . Asthma   . Cancer ([place])    cervical  . Cervical cancer ([place])   . Depression   . GERD (gastroesophageal reflux disease)   . History of carpal tunnel surgery of right wrist   . History of carpal tunnel surgery of right wrist   . Hypothyroidism   . Low blood pressure   . Lumbar herniated disc   . PTSD (post-traumatic stress disorder)   . Sciatica   . Sleep apnea   . Thyroid disease    hypothyroidism     Family History  Problem Relation Age of Onset  . Hypertension Mother   . Thyroid disease Mother   . Heart disease Mother   .  Asthma Mother   . Hypertension Father   . Thyroid disease Sister   . Diabetes Brother   . Hypertension Brother   . Kidney disease Brother      Past Surgical History:  Procedure Laterality Date  . ABDOMINAL HYSTERECTOMY    . APPENDECTOMY    . CHOLECYSTECTOMY    . cholesectomy    . HIP SURGERY Left    sciatica-related, SI joint surgery  . KNEE SURGERY    . OOPHORECTOMY      Social History   Socioeconomic History  . Marital status: Married    Spouse name: Not on file  . Number of children: Not on file  . Years of education: Not on file  . Highest education level: Not on file  Occupational History  . Not on file  Social Needs  . Financial resource strain: Not on file  .  Food insecurity    Worry: Not on file    Inability: Not on file  . Transportation needs    Medical: Not on file    Non-medical: Not on file  Tobacco Use  . Smoking status: Never Smoker  . Smokeless tobacco: Never Used  Substance and Sexual Activity  . Alcohol use: No    Frequency: Never  . Drug use: No  . Sexual activity: Not on file  Lifestyle  . Physical activity    Days per week: Not on file    Minutes per session: Not on file  . Stress: Not on file  Relationships  . Social Herbalist on phone: Not on file    Gets together: Not on file    Attends religious service: Not on file    Active member of club or organization: Not on file    Attends meetings of clubs or organizations: Not on file    Relationship status: Not on file  . Intimate partner violence    Fear of current or ex partner: Not on file    Emotionally abused: Not on file    Physically abused: Not on file    Forced sexual activity: Not on file  Other Topics Concern  . Not on file  Social History Narrative  . Not on file     Allergies  Allergen Reactions  . Shellfish Allergy Anaphylaxis    Reaction to shrimp only  . Lidocaine Hives     Immunization History  Administered Date(s) Administered  . Influenza Whole 11/28/2017    Outpatient Medications Prior to Visit  Medication Sig Dispense Refill  . Calcium Carb-Cholecalciferol (CALCIUM 500 +D PO) Take 1 tablet by mouth daily.    . diazepam (VALIUM) 5 MG tablet Take 5 mg by mouth 2 (two) times daily as needed for anxiety.    . divalproex (DEPAKOTE) 500 MG DR tablet Take 500 mg by mouth daily.    Marland Kitchen esomeprazole (NEXIUM) 40 MG capsule Take 40 mg by mouth daily at 12 noon.    Marland Kitchen estradiol (ESTRACE) 1 MG tablet Take 1 mg by mouth daily.    . hydrOXYzine (ATARAX/VISTARIL) 25 MG tablet Take 25 mg by mouth at bedtime.     Marland Kitchen levothyroxine (SYNTHROID) 100 MCG tablet Take 100 mcg by mouth daily before breakfast.    . methocarbamol (ROBAXIN) 750 MG  tablet Take 750 mg by mouth 2 (two) times daily.    . Multiple Vitamin (MULTIVITAMIN WITH MINERALS) TABS tablet Take 1 tablet by mouth daily. Centrum    . QUEtiapine (SEROQUEL) 100 MG tablet  Take 100 mg by mouth at bedtime.    . sertraline (ZOLOFT) 100 MG tablet Take 100 mg by mouth at bedtime.     . valACYclovir (VALTREX) 500 MG tablet Take 500 mg by mouth daily.    Marland Kitchen albuterol (PROVENTIL HFA;VENTOLIN HFA) 108 (90 Base) MCG/ACT inhaler Inhale 1-2 puffs into the lungs every 6 (six) hours as needed for wheezing or shortness of breath. 1 Inhaler 0  . benzonatate (TESSALON) 100 MG capsule Take 1 capsule (100 mg total) by mouth every 8 (eight) hours. (Patient not taking: Reported on 09/18/2018) 21 capsule 0   No facility-administered medications prior to visit.     Review of Systems  Constitutional: Positive for malaise/fatigue. Negative for chills, diaphoresis, fever and weight loss.  HENT: Negative for congestion, ear pain and sore throat.   Respiratory: Positive for shortness of breath and wheezing. Negative for cough, hemoptysis and sputum production.   Cardiovascular: Positive for chest pain, palpitations and leg swelling.  Gastrointestinal: Positive for heartburn. Negative for abdominal pain and nausea.  Genitourinary: Positive for frequency.  Musculoskeletal: Positive for joint pain and myalgias.  Skin: Negative for itching and rash.  Neurological: Positive for weakness. Negative for dizziness and headaches.  Endo/Heme/Allergies: Does not bruise/bleed easily.  Psychiatric/Behavioral: Positive for depression. The patient is nervous/anxious.      Objective:   Vitals:   09/26/18 1115 09/26/18 1117  BP:  110/78  Pulse:  80  Temp: 97.6 F (36.4 C)   TempSrc: Oral   SpO2:  100%  Weight: 196 lb 6.4 oz (89.1 kg)   Height: 5' 0.25" (1.53 m)    100% on RA BMI Readings from Last 3 Encounters:  09/26/18 38.04 kg/m  09/18/18 37.89 kg/m  03/10/18 39.45 kg/m   Wt Readings from Last  3 Encounters:  09/26/18 196 lb 6.4 oz (89.1 kg)  09/18/18 194 lb (88 kg)  03/10/18 202 lb (91.6 kg)    Physical Exam Constitutional:      General: She is not in acute distress.    Appearance: Normal appearance. She is obese. She is not ill-appearing.  HENT:     Head: Normocephalic and atraumatic.     Nose: No congestion or rhinorrhea.     Mouth/Throat:     Mouth: Mucous membranes are moist.  Eyes:     General: No scleral icterus. Neck:     Musculoskeletal: Neck supple.  Cardiovascular:     Rate and Rhythm: Normal rate and regular rhythm.     Heart sounds: No murmur. No friction rub.  Pulmonary:     Comments: Breathing comfortably on room air.  No tachypnea or conversational dyspnea.  Clear to auscultation bilaterally. Abdominal:     Comments: Obese, soft, nontender, nondistended, no hepatosplenomegaly.  Musculoskeletal:     Comments: Mild, symmetric pretibial edema  Lymphadenopathy:     Cervical: No cervical adenopathy.  Skin:    General: Skin is warm and dry.     Findings: No rash.     Comments: Entire right chest wall examined without evidence of vesicles or other rash.  Neurological:     General: No focal deficit present.     Mental Status: She is alert.     Comments: Ambulates with cane, but was able to take several small steps unassisted.  Psychiatric:        Mood and Affect: Mood normal.        Behavior: Behavior normal.     CBC    Component Value Date/Time  WBC 8.7 09/18/2018 1451   RBC 4.35 09/18/2018 1451   HGB 13.1 09/18/2018 1451   HCT 39.8 09/18/2018 1451   PLT 299 09/18/2018 1451   MCV 91.5 09/18/2018 1451   MCH 30.1 09/18/2018 1451   MCHC 32.9 09/18/2018 1451   RDW 11.7 09/18/2018 1451   Labs from 09/18/2018: D-dimer 0.43 High-sensitivity troponin 2  Chest Imaging- films reviewed: Chest x-ray 09/18/2018 no evidence of broken ribs, pleural thickening, pleural effusion, masses, or other abnormalities.  Pulmonary Functions Testing Results: No  flowsheet data found.  No PFTs available in care everywhere.  EKG 09/18/18: Normal sinus rhythm, normal axis, no PR depression, ST segment changes, or T wave inversions.    Assessment & Plan:     ICD-10-CM   1. Pleuritic chest pain  R07.81   2. Morbid obesity due to excess calories (HCC)  E66.01 Pulmonary function test  3. Mild persistent asthma without complication  V25.36 Pulmonary function test  4. OSA on CPAP  G47.33    Z99.89    Pleuritic chest pain on the right, recurrent.  Suspect this is related to her recent bronchitis and coughing.  With a normal d-dimer, PE is very unlikely. No changes on EKG to suggest pericarditis, which would typically present with left-sided symptoms. CXR reassurring that there is no significant pleural inflammation. -Conservative management.  Heat compresses and over-the-counter pain medications can help with this, which should self- resolve.  Continuing prednisone would be unwise given the potential side effects.  Asthma, mild persistent -PFTs.  She has been reminded not to use her albuterol inhaler the day of the study. -We will likely start ICS at next visit. -Recommended modest weight loss given the correlation with difficult to control asthma and obesity in women.  Obesity -Recommend modest weight loss  OSA on CPAP - I have asked her to contact her DME company to request a report on her machine to bring to the next visit.  Would like to obtain a copy of her PSG results, but this may not be possible.  Current Outpatient Medications:  .  albuterol (VENTOLIN HFA) 108 (90 Base) MCG/ACT inhaler, Inhale 1-2 puffs into the lungs every 6 (six) hours as needed for wheezing or shortness of breath., Disp: 6.7 g, Rfl: 1 .  Calcium Carb-Cholecalciferol (CALCIUM 500 +D PO), Take 1 tablet by mouth daily., Disp: , Rfl:  .  diazepam (VALIUM) 5 MG tablet, Take 5 mg by mouth 2 (two) times daily as needed for anxiety., Disp: , Rfl:  .  divalproex (DEPAKOTE) 500 MG DR  tablet, Take 500 mg by mouth daily., Disp: , Rfl:  .  esomeprazole (NEXIUM) 40 MG capsule, Take 40 mg by mouth daily at 12 noon., Disp: , Rfl:  .  estradiol (ESTRACE) 1 MG tablet, Take 1 mg by mouth daily., Disp: , Rfl:  .  hydrOXYzine (ATARAX/VISTARIL) 25 MG tablet, Take 25 mg by mouth at bedtime. , Disp: , Rfl:  .  levothyroxine (SYNTHROID) 100 MCG tablet, Take 100 mcg by mouth daily before breakfast., Disp: , Rfl:  .  methocarbamol (ROBAXIN) 750 MG tablet, Take 750 mg by mouth 2 (two) times daily., Disp: , Rfl:  .  Multiple Vitamin (MULTIVITAMIN WITH MINERALS) TABS tablet, Take 1 tablet by mouth daily. Centrum, Disp: , Rfl:  .  QUEtiapine (SEROQUEL) 100 MG tablet, Take 100 mg by mouth at bedtime., Disp: , Rfl:  .  sertraline (ZOLOFT) 100 MG tablet, Take 100 mg by mouth at bedtime. , Disp: ,  Rfl:  .  valACYclovir (VALTREX) 500 MG tablet, Take 500 mg by mouth daily., Disp: , Rfl:  .  benzonatate (TESSALON) 100 MG capsule, Take 1 capsule (100 mg total) by mouth every 8 (eight) hours. (Patient not taking: Reported on 09/18/2018), Disp: 21 capsule, Rfl: 0   Julian Hy, DO [facility] 09/26/2018 12:23 PM

## 2019-06-13 ENCOUNTER — Other Ambulatory Visit: Payer: Self-pay

## 2019-06-13 ENCOUNTER — Emergency Department (HOSPITAL_COMMUNITY): Payer: Federal, State, Local not specified - PPO

## 2019-06-13 ENCOUNTER — Encounter (HOSPITAL_COMMUNITY): Payer: Self-pay | Admitting: Emergency Medicine

## 2019-06-13 ENCOUNTER — Emergency Department (HOSPITAL_COMMUNITY)
Admission: EM | Admit: 2019-06-13 | Discharge: 2019-06-13 | Disposition: A | Discharge status: Home / Self Care | Payer: Federal, State, Local not specified - PPO | Attending: Emergency Medicine | Admitting: Emergency Medicine

## 2019-06-13 DIAGNOSIS — M7918 Myalgia, other site: Secondary | ICD-10-CM | POA: Insufficient documentation

## 2019-06-13 DIAGNOSIS — Z79899 Other long term (current) drug therapy: Secondary | ICD-10-CM | POA: Diagnosis not present

## 2019-06-13 DIAGNOSIS — Z8541 Personal history of malignant neoplasm of cervix uteri: Secondary | ICD-10-CM | POA: Insufficient documentation

## 2019-06-13 DIAGNOSIS — R0789 Other chest pain: Secondary | ICD-10-CM | POA: Diagnosis present

## 2019-06-13 DIAGNOSIS — E039 Hypothyroidism, unspecified: Secondary | ICD-10-CM | POA: Diagnosis not present

## 2019-06-13 LAB — CBC
HCT: 40 % (ref 36.0–46.0)
Hemoglobin: 13.5 g/dL (ref 12.0–15.0)
MCH: 30.5 pg (ref 26.0–34.0)
MCHC: 33.8 g/dL (ref 30.0–36.0)
MCV: 90.3 fL (ref 80.0–100.0)
Platelets: 289 10*3/uL (ref 150–400)
RBC: 4.43 MIL/uL (ref 3.87–5.11)
RDW: 12.5 % (ref 11.5–15.5)
WBC: 8.6 10*3/uL (ref 4.0–10.5)
nRBC: 0 % (ref 0.0–0.2)

## 2019-06-13 LAB — BASIC METABOLIC PANEL
Anion gap: 8 (ref 5–15)
BUN: 16 mg/dL (ref 6–20)
CO2: 29 mmol/L (ref 22–32)
Calcium: 9.1 mg/dL (ref 8.9–10.3)
Chloride: 103 mmol/L (ref 98–111)
Creatinine, Ser: 0.87 mg/dL (ref 0.44–1.00)
GFR calc Af Amer: >60 mL/min (ref >60)
GFR calc non Af Amer: >60 mL/min (ref >60)
Glucose, Bld: 98 mg/dL (ref 70–99)
Potassium: 3.6 mmol/L (ref 3.5–5.1)
Sodium: 140 mmol/L (ref 135–145)

## 2019-06-13 LAB — TROPONIN I (HIGH SENSITIVITY): Troponin I (High Sensitivity): 2 ng/L (ref <18)

## 2019-06-13 MED ORDER — METAXALONE 800 MG PO TABS
800.0000 mg | ORAL_TABLET | Freq: Three times a day (TID) | ORAL | 0 refills | Status: DC
Start: 2019-06-13 — End: 2020-05-09

## 2019-06-13 MED ORDER — SODIUM CHLORIDE 0.9% FLUSH: 3.0000 mL | Freq: Once | INTRAVENOUS | Status: DC

## 2019-06-13 NOTE — ED Triage Notes (Signed)
Pt reports that since last night had chest pains that radiates to her back that is worse with movement and taking a deep breath.

## 2019-06-13 NOTE — ED Provider Notes (Signed)
[facility] DEPT Provider Note   CSN: [id number] Arrival date & time: 06/13/19  1816     History Chief Complaint  Patient presents with  . Chest Pain    Frances Davis is a 39 y.o. female.  39 year old female who presents after having chest pain 1 that starts in her mid chest which goes to her back that is worse with movement.  Denies any fever or cough.  She is not been short of breath.  Has been having some GERD symptoms but that is chronic for her.  Denies any syncope or near syncope.  Denies any leg pain or swelling.  No treatment use prior to arrival.        Past Medical History:  Diagnosis Date  . Anxiety   . Arthritis   . Asthma   . Cancer ([place])    cervical  . Cervical cancer ([place])   . Depression   . GERD (gastroesophageal reflux disease)   . History of carpal tunnel surgery of right wrist   . History of carpal tunnel surgery of right wrist   . Hypothyroidism   . Low blood pressure   . Lumbar herniated disc   . PTSD (post-traumatic stress disorder)   . Sciatica   . Sleep apnea   . Thyroid disease    hypothyroidism    Patient Active Problem List   Diagnosis Date Noted  . OSA on CPAP 09/26/2018  . Upper respiratory infection 04/15/2017  . Rash of genital area 04/15/2017    Past Surgical History:  Procedure Laterality Date  . ABDOMINAL HYSTERECTOMY    . APPENDECTOMY    . CHOLECYSTECTOMY    . cholesectomy    . HIP SURGERY Left    sciatica-related, SI joint surgery  . KNEE SURGERY    . OOPHORECTOMY       OB History    Gravida  2   Para  2   Term  2   Preterm      AB      Living  2     SAB      TAB      Ectopic      Multiple      Live Births  2           Family History  Problem Relation Age of Onset  . Hypertension Mother   . Thyroid disease Mother   . Heart disease Mother   . Asthma Mother   . Hypertension Father   . Thyroid disease Sister   . Diabetes Brother   . Hypertension  Brother   . Kidney disease Brother     Social History   Tobacco Use  . Smoking status: Never Smoker  . Smokeless tobacco: Never Used  Substance Use Topics  . Alcohol use: No  . Drug use: No    Home Medications Prior to Admission medications   Medication Sig Start Date End Date Taking? Authorizing Provider  albuterol (VENTOLIN HFA) 108 (90 Base) MCG/ACT inhaler Inhale 1-2 puffs into the lungs every 6 (six) hours as needed for wheezing or shortness of breath. 09/26/18   Julian Hy, DO  benzonatate (TESSALON) 100 MG capsule Take 1 capsule (100 mg total) by mouth every 8 (eight) hours. Patient not taking: Reported on 09/18/2018 03/10/18   Darlin Drop P, PA-C  Calcium Carb-Cholecalciferol (CALCIUM 500 +D PO) Take 1 tablet by mouth daily.    [provider]  diazepam (VALIUM) 5 MG tablet Take 5  mg by mouth 2 (two) times daily as needed for anxiety.    [provider]  divalproex (DEPAKOTE) 500 MG DR tablet Take 500 mg by mouth daily.    [provider]  esomeprazole (NEXIUM) 40 MG capsule Take 40 mg by mouth daily at 12 noon.    [provider]  estradiol (ESTRACE) 1 MG tablet Take 1 mg by mouth daily.    [provider]  hydrOXYzine (ATARAX/VISTARIL) 25 MG tablet Take 25 mg by mouth at bedtime.     [provider]  levothyroxine (SYNTHROID) 100 MCG tablet Take 100 mcg by mouth daily before breakfast.    [provider]  methocarbamol (ROBAXIN) 750 MG tablet Take 750 mg by mouth 2 (two) times daily.    [provider]  Multiple Vitamin (MULTIVITAMIN WITH MINERALS) TABS tablet Take 1 tablet by mouth daily. Centrum    [provider]  QUEtiapine (SEROQUEL) 100 MG tablet Take 100 mg by mouth at bedtime.    [provider]  sertraline (ZOLOFT) 100 MG tablet Take 100 mg by mouth at bedtime.     [provider]  valACYclovir (VALTREX) 500 MG tablet Take 500 mg by mouth daily.    [provider]    Allergies    Shellfish allergy and Lidocaine  Review of Systems   Review of Systems  All other systems reviewed and are negative.   Physical Exam Updated Vital Signs BP 91/67   Pulse 78   Temp 98.4 F (36.9 C)   Resp 18   SpO2 100%   Physical Exam Vitals and nursing note reviewed.  Constitutional:      General: She is not in acute distress.    Appearance: Normal appearance. She is well-developed. She is not toxic-appearing.  HENT:     Head: Normocephalic and atraumatic.  Eyes:     General: Lids are normal.     Conjunctiva/sclera: Conjunctivae normal.     Pupils: Pupils are equal, round, and reactive to light.  Neck:     Thyroid: No thyroid mass.     Trachea: No tracheal deviation.  Cardiovascular:     Rate and Rhythm: Normal rate and regular rhythm.     Heart sounds: Normal heart sounds. No murmur. No gallop.   Pulmonary:     Effort: Pulmonary effort is normal. No respiratory distress.     Breath sounds: Normal breath sounds. No stridor. No decreased breath sounds, wheezing, rhonchi or rales.  Abdominal:     General: Bowel sounds are normal. There is no distension.     Palpations: Abdomen is soft.     Tenderness: There is no abdominal tenderness. There is no rebound.  Musculoskeletal:        General: No tenderness. Normal range of motion.     Cervical back: Normal range of motion and neck supple.       Back:  Skin:    General: Skin is warm and dry.     Findings: No abrasion or rash.  Neurological:     Mental Status: She is alert and oriented to person, place, and time.     GCS: GCS eye subscore is 4. GCS verbal subscore is 5. GCS motor subscore is 6.     Cranial Nerves: No cranial nerve deficit.     Sensory: No sensory deficit.  Psychiatric:        Speech: Speech normal.        Behavior: Behavior normal.  ED Results / Procedures / Treatments   Labs (all labs ordered are listed, but only abnormal results are displayed) Labs  Reviewed  BASIC METABOLIC PANEL  CBC  TROPONIN I (HIGH SENSITIVITY)  TROPONIN I (HIGH SENSITIVITY)    EKG EKG Interpretation  Date/Time:  Wednesday June 13 2019 18:25:58 EDT Ventricular Rate:  78 PR Interval:    QRS Duration: 116 QT Interval:  368 QTC Calculation: 420 R Axis:     Text Interpretation: Normal sinus rhythm Confirmed by Lacretia Leigh ([id number]) on 06/13/2019 10:09:40 PM   Radiology DG Chest 2 View  Result Date: 06/13/2019 CLINICAL DATA:  Chest pain. EXAM: CHEST - 2 VIEW COMPARISON:  None. FINDINGS: The heart size and mediastinal contours are within normal limits. Both lungs are clear. The visualized skeletal structures are unremarkable. IMPRESSION: No active cardiopulmonary disease. Electronically Signed   By: Virgina Norfolk M.D.   On: 06/13/2019 19:12    Procedures Procedures (including critical care time)  Medications Ordered in ED Medications  sodium chloride flush (NS) 0.9 % injection 3 mL (has no administration in time range)    ED Course  I have reviewed the triage vital signs and the nursing notes.  Pertinent labs & imaging results that were available during my care of the patient were reviewed by me and considered in my medical decision making (see chart for details).    MDM Rules/Calculators/A&P                      Patient with reproducible musculoskeletal pain.  Patient is EKG is nonacute.  Chest x-ray and troponin negative.  Suspect musculoskeletal etiology and will discharge Final Clinical Impression(s) / ED Diagnoses Final diagnoses:  None    Rx / DC Orders ED Discharge Orders    None       Lacretia Leigh, MD 06/13/19 2232

## 2019-10-25 ENCOUNTER — Other Ambulatory Visit: Payer: Self-pay | Admitting: Internal Medicine

## 2019-10-25 DIAGNOSIS — E041 Nontoxic single thyroid nodule: Secondary | ICD-10-CM

## 2019-11-01 ENCOUNTER — Ambulatory Visit
Admission: RE | Admit: 2019-11-01 | Discharge: 2019-11-01 | Disposition: A | Discharge status: Home / Self Care | Payer: Medicare Other | Source: Ambulatory Visit | Attending: Internal Medicine | Admitting: Internal Medicine

## 2019-11-01 DIAGNOSIS — E041 Nontoxic single thyroid nodule: Secondary | ICD-10-CM

## 2020-01-28 ENCOUNTER — Other Ambulatory Visit (HOSPITAL_COMMUNITY): Payer: Self-pay | Admitting: *Deleted

## 2020-01-29 ENCOUNTER — Ambulatory Visit (HOSPITAL_COMMUNITY)
Admission: RE | Admit: 2020-01-29 | Discharge: 2020-01-29 | Disposition: A | Discharge status: Home / Self Care | Payer: Federal, State, Local not specified - PPO | Source: Ambulatory Visit | Attending: Internal Medicine | Admitting: Internal Medicine

## 2020-01-29 ENCOUNTER — Other Ambulatory Visit: Payer: Self-pay

## 2020-01-29 DIAGNOSIS — I950 Idiopathic hypotension: Secondary | ICD-10-CM | POA: Diagnosis not present

## 2020-01-29 DIAGNOSIS — R42 Dizziness and giddiness: Secondary | ICD-10-CM | POA: Insufficient documentation

## 2020-01-29 LAB — ACTH STIMULATION, 3 TIME POINTS
Cortisol, 30 Min: 18.6 ug/dL
Cortisol, 60 Min: 20.7 ug/dL
Cortisol, Base: 9.5 ug/dL

## 2020-01-29 MED ORDER — COSYNTROPIN 0.25 MG IJ SOLR
INTRAMUSCULAR | Status: AC
  Administered 2020-01-29: 0.25 mg via INTRAVENOUS
  Filled 2020-01-29: qty 0.25

## 2020-01-29 MED ORDER — COSYNTROPIN 0.25 MG IJ SOLR: 0.2500 mg | Freq: Once | INTRAMUSCULAR | Status: AC

## 2020-02-21 ENCOUNTER — Other Ambulatory Visit: Payer: Self-pay

## 2020-02-21 ENCOUNTER — Ambulatory Visit (INDEPENDENT_AMBULATORY_CARE_PROVIDER_SITE_OTHER): Payer: Federal, State, Local not specified - PPO | Admitting: Pulmonary Disease

## 2020-02-21 ENCOUNTER — Encounter: Payer: Self-pay | Admitting: Pulmonary Disease

## 2020-02-21 DIAGNOSIS — G4733 Obstructive sleep apnea (adult) (pediatric): Secondary | ICD-10-CM

## 2020-02-21 DIAGNOSIS — J4520 Mild intermittent asthma, uncomplicated: Secondary | ICD-10-CM | POA: Diagnosis not present

## 2020-02-21 DIAGNOSIS — Z9989 Dependence on other enabling machines and devices: Secondary | ICD-10-CM | POA: Diagnosis not present

## 2020-02-21 MED ORDER — ALBUTEROL SULFATE HFA 108 (90 BASE) MCG/ACT IN AERS: 1.0000 | INHALATION_SPRAY | Freq: Four times a day (QID) | RESPIRATORY_TRACT | 5 refills | Status: AC | PRN

## 2020-02-21 NOTE — Assessment & Plan Note (Signed)
She has lost 40 pounds since her original sleep study in 2018.  Sleep apnea was moderate but this may have improved, she appears to be symptomatically improved. We will repeat a home sleep test to reassess. Based on this we will decide whether she needs to continue on CPAP therapy.

## 2020-02-21 NOTE — Patient Instructions (Signed)
Schedule home sleep test. Based on this we will decide whether you need to continue on CPAP.  Refills on albuterol

## 2020-02-21 NOTE — Assessment & Plan Note (Signed)
Albuterol refills will be provided. She also reports 6-7 attacks of sore throat/tonsillitis in the year, "I am tired of it" she may need ENT referral for tonsillectomy if this continues

## 2020-02-21 NOTE — Progress Notes (Signed)
Subjective:    Patient ID: Frances Davis, female    DOB: 1980-05-23, 40 y.o.   MRN: [id number]  HPI  Chief Complaint  Patient presents with  . Follow-up    Former pt of Dr. Chestine Spore.  Pt stated that she has lost weight and she is unable to tolerate the pressure of the machine.  She has not been able to use the machine for about 8 months.   40 year old woman presents to establish care for OSA and OSA .  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 Chestine Spore She has a history of mild intermittent asthma, requiring as needed albuterol.  Symptoms are worse on the fall She reports regular attacks of sore throat and tonsillitis about 6 to 7/year  OSA was diagnosed in [place] in 2018, home sleep test showed moderate OSA, she was placed on auto CPAP 6 to 18 cm with nasal mask AirFit M 20 and felt improvement in her daytime somnolence and fatigue.  She weighed around 192 pounds then.  Her obesity also caused her knee problems and sacroiliac joint pain for which she required an infusion, this did not work and she currently ambulates with a cane. She went on a keto diet and has lost 40 pounds, she could not tolerate pressure on her auto CPAP. Epworth sleepiness score is 8 and she denies excessive sleep pressure, somnolence.  No snoring has been noted by her husband who also uses a CPAP machine Bedtime is around 11 PM, sleep latency is about 20 to 30 minutes, she reports 1-2 nocturnal awakenings and nocturia and is out of bed by 7 AM feeling rested without dryness of mouth or headaches There is no history suggestive of cataplexy, sleep paralysis or parasomnias   Significant tests/ events reviewed   HST  04/2016 -192 pounds -mod, AHI 18/h, worse supine 25/h, lowest satn 72 %  Past Medical History:  Diagnosis Date   . Anxiety   . Arthritis   . Asthma   . Cancer (HCC)    cervical  . Cervical cancer (HCC)   . Depression   . GERD (gastroesophageal reflux disease)   . History of carpal tunnel surgery of right wrist   . History of carpal tunnel surgery of right wrist   . Hypothyroidism   . Low blood pressure   . Lumbar herniated disc   . PTSD (post-traumatic stress disorder)   . Sciatica   . Sleep apnea   . Thyroid disease    hypothyroidism   Past Surgical History:  Procedure Laterality Date  . ABDOMINAL HYSTERECTOMY    . APPENDECTOMY    . CHOLECYSTECTOMY    . cholesectomy    . HIP SURGERY Left    sciatica-related, SI joint surgery  . KNEE SURGERY    . OOPHORECTOMY      Allergies  Allergen Reactions  . Shellfish Allergy Anaphylaxis    Reaction to shrimp only  . Lidocaine Hives    Social History   Socioeconomic History  . Marital status: Married    Spouse name: Not on file  . Number of children: Not on file  . Years of education: Not on file  . Highest education level: Not on file  Occupational History  . Not on file  Tobacco Use  . Smoking status: Never Smoker  .  Smokeless tobacco: Never Used  Vaping Use  . Vaping Use: Never used  Substance and Sexual Activity  . Alcohol use: No  . Drug use: No  . Sexual activity: Not on file  Other Topics Concern  . Not on file  Social History Narrative  . Not on file   Social Determinants of Health   Financial Resource Strain: Not on file  Food Insecurity: Not on file  Transportation Needs: Not on file  Physical Activity: Not on file  Stress: Not on file  Social Connections: Not on file  Intimate Partner Violence: Not on file    Family History  Problem Relation Age of Onset  . Hypertension Mother   . Thyroid disease Mother   . Heart disease Mother   . Asthma Mother   . Hypertension Father   . Thyroid disease Sister   . Diabetes Brother   . Hypertension Brother   . Kidney disease Brother      Review of  Systems Constitutional: negative for anorexia, fevers and sweats  Eyes: negative for irritation, redness and visual disturbance  Ears, nose, mouth, throat, and face: negative for earaches, epistaxis, nasal congestion and sore throat  Respiratory: negative for cough, dyspnea on exertion, sputum and wheezing  Cardiovascular: negative for chest pain, dyspnea, lower extremity edema, orthopnea, palpitations and syncope  Gastrointestinal: negative for abdominal pain, constipation, diarrhea, melena, nausea and vomiting  Genitourinary:negative for dysuria, frequency and hematuria  Hematologic/lymphatic: negative for bleeding, easy bruising and lymphadenopathy  Musculoskeletal:negative for arthralgias, muscle weakness and stiff joints  Neurological: negative for coordination problems, gait problems, headaches and weakness  Endocrine: negative for diabetic symptoms including polydipsia, polyuria and weight loss     Objective:   Physical Exam  Gen. Pleasant, obese, in no distress ENT - no lesions, no post nasal drip, normal tonsillar tissue, class II airway Neck: No JVD, no thyromegaly, no carotid bruits Lungs: no use of accessory muscles, no dullness to percussion, decreased without rales or rhonchi  Cardiovascular: Rhythm regular, heart sounds  normal, no murmurs or gallops, no peripheral edema Musculoskeletal: No deformities, no cyanosis or clubbing , no tremors       Assessment & Plan:

## 2020-03-14 ENCOUNTER — Ambulatory Visit (INDEPENDENT_AMBULATORY_CARE_PROVIDER_SITE_OTHER): Payer: Federal, State, Local not specified - PPO

## 2020-03-14 ENCOUNTER — Encounter: Payer: Self-pay | Admitting: Internal Medicine

## 2020-03-14 ENCOUNTER — Ambulatory Visit: Payer: Federal, State, Local not specified - PPO

## 2020-03-14 ENCOUNTER — Ambulatory Visit (INDEPENDENT_AMBULATORY_CARE_PROVIDER_SITE_OTHER): Payer: Federal, State, Local not specified - PPO | Admitting: Internal Medicine

## 2020-03-14 ENCOUNTER — Other Ambulatory Visit: Payer: Self-pay

## 2020-03-14 VITALS — BP 96/68 | HR 78 | Ht 60.0 in | Wt 150.6 lb

## 2020-03-14 DIAGNOSIS — Z8679 Personal history of other diseases of the circulatory system: Secondary | ICD-10-CM | POA: Diagnosis not present

## 2020-03-14 DIAGNOSIS — G4733 Obstructive sleep apnea (adult) (pediatric): Secondary | ICD-10-CM

## 2020-03-14 DIAGNOSIS — I4891 Unspecified atrial fibrillation: Secondary | ICD-10-CM | POA: Diagnosis not present

## 2020-03-14 DIAGNOSIS — Z01810 Encounter for preprocedural cardiovascular examination: Secondary | ICD-10-CM

## 2020-03-14 DIAGNOSIS — R079 Chest pain, unspecified: Secondary | ICD-10-CM | POA: Diagnosis not present

## 2020-03-14 DIAGNOSIS — Z9989 Dependence on other enabling machines and devices: Secondary | ICD-10-CM

## 2020-03-14 NOTE — Progress Notes (Signed)
Cardiology Office Note:    Date:  03/14/2020   ID:  Frances Davis, DOB 11/22/1980, MRN [id number]  PCP:  Merrilee Seashore, MD  Cardiologist:  No primary care provider on file.  Electrophysiologist:  None   Referring MD: Merrilee Seashore, MD   Chief Complaint/Reason for Referral: Afib, preoperative cardiovascular evaluation.  History of Present Illness:    Frances Davis is a 40 y.o. female with a history of asthma, OSA, cervical cancer s/p hysterectomy, and hypothyroidism who presents for evaluation of Afib. Primary care and endocrinology records are not available for my review at the time of this consultation.     She is currently in sinus rhythm. She notes having atrial fibrillation during the post operative period of the last several surgeries. First detected while living in [place] for cervical cancer surgery and hysterectomy.  She feels she currently has low BP and feels weak. She has chest pain and tightness with palpitations. She also note a history of stroke while in [place] in 2006. She recalls stroke being documented on head imaging.   She notes being born with a murmur, and felt weak and sick in childhood. Allergy to motrin detected in her youth, 2 mo in hospital in coma.   2 living children 1 miscarriage. GDM, both early delivery. Preeclampsia with second preg.   OSA dx previously, undergoing an evaluation with PCCM.  FHx:  Mom - strokes x 2, afib Bro - stroke   Past Medical History:  Diagnosis Date  . Anxiety   . Arthritis   . Asthma   . Cancer ([place])    cervical  . Cervical cancer ([place])   . Depression   . GERD (gastroesophageal reflux disease)   . History of carpal tunnel surgery of right wrist   . History of carpal tunnel surgery of right wrist   . Hypothyroidism   . Low blood pressure   . Lumbar herniated disc   . PTSD (post-traumatic stress disorder)   . Sciatica   . Sleep apnea   . Thyroid disease    hypothyroidism    Past Surgical  History:  Procedure Laterality Date  . ABDOMINAL HYSTERECTOMY    . APPENDECTOMY    . CHOLECYSTECTOMY    . cholesectomy    . HIP SURGERY Left    sciatica-related, SI joint surgery  . KNEE SURGERY    . OOPHORECTOMY      Current Medications: Current Meds  Medication Sig  . albuterol (VENTOLIN HFA) 108 (90 Base) MCG/ACT inhaler Inhale 1-2 puffs into the lungs every 6 (six) hours as needed for wheezing or shortness of breath.  . Calcium Carb-Cholecalciferol (CALCIUM 500 +D PO) Take 1 tablet by mouth daily.  . diazepam (VALIUM) 5 MG tablet Take 5 mg by mouth 2 (two) times daily as needed for anxiety.  . divalproex (DEPAKOTE) 500 MG DR tablet Take 500 mg by mouth daily.  Marland Kitchen esomeprazole (NEXIUM) 40 MG capsule Take 40 mg by mouth daily at 12 noon.  Marland Kitchen estradiol (ESTRACE) 1 MG tablet Take 1 mg by mouth daily.  . hydrOXYzine (ATARAX/VISTARIL) 25 MG tablet Take 25 mg by mouth at bedtime.   Marland Kitchen levothyroxine (SYNTHROID) 25 MCG tablet 1 tablet in the morning on an empty stomach  . metaxalone (SKELAXIN) 800 MG tablet Take 1 tablet (800 mg total) by mouth 3 (three) times daily.  . methocarbamol (ROBAXIN) 750 MG tablet Take 750 mg by mouth 2 (two) times daily.  . Multiple Vitamin (MULTIVITAMIN WITH MINERALS) TABS tablet  Take 1 tablet by mouth daily. Centrum  . OZEMPIC, 1 MG/DOSE, 4 MG/3ML SOPN INJECT 1 MG SUBCUTANEOUSLY ONCE A WEEK  . QUEtiapine (SEROQUEL) 100 MG tablet Take 100 mg by mouth at bedtime.  . sertraline (ZOLOFT) 100 MG tablet Take 100 mg by mouth at bedtime.   . valACYclovir (VALTREX) 500 MG tablet Take 500 mg by mouth daily.     Allergies:   Shellfish allergy and Lidocaine   Social History   Tobacco Use  . Smoking status: Never Smoker  . Smokeless tobacco: Never Used  Vaping Use  . Vaping Use: Never used  Substance Use Topics  . Alcohol use: No  . Drug use: No     Family History: The patient's family history includes Asthma in her mother; Diabetes in her brother; Heart  disease in her mother; Hypertension in her brother, father, and mother; Kidney disease in her brother; Thyroid disease in her mother and sister.  ROS:   Please see the history of present illness.    All other systems reviewed and are negative.  EKGs/Labs/Other Studies Reviewed:    The following studies were reviewed today:  EKG:  NSR, iRBBB, QRS duration 102  Recent Labs: 06/13/2019: BUN 16; Creatinine, Ser 0.87; Hemoglobin 13.5; Platelets 289; Potassium 3.6; Sodium 140  Recent Lipid Panel No results found for: CHOL, TRIG, HDL, CHOLHDL, VLDL, LDLCALC, LDLDIRECT  Physical Exam:    VS:  BP 96/68 (BP Location: Left Arm, Patient Position: Sitting)   Pulse 78   Ht 5' (1.524 m)   Wt 150 lb 9.6 oz (68.3 kg)   SpO2 97%   BMI 29.41 kg/m     Wt Readings from Last 5 Encounters:  03/14/20 150 lb 9.6 oz (68.3 kg)  02/21/20 152 lb 4 oz (69.1 kg)  09/26/18 196 lb 6.4 oz (89.1 kg)  09/18/18 194 lb (88 kg)  03/10/18 202 lb (91.6 kg)    Constitutional: No acute distress Eyes: sclera non-icteric, normal conjunctiva and lids ENMT: normal dentition, moist mucous membranes Cardiovascular: regular rhythm, normal rate, no murmurs. S1 and S2 normal. Radial pulses normal bilaterally. No jugular venous distention.  Respiratory: clear to auscultation bilaterally GI : normal bowel sounds, soft and nontender. No distention.   MSK: extremities warm, well perfused. No edema.  NEURO: grossly nonfocal exam, moves all extremities. PSYCH: alert and oriented x 3, normal mood and affect.   ASSESSMENT:    1. History of atrial fibrillation   2. Atrial fibrillation, unspecified type (HCC)   3. OSA on CPAP   4. Chest pain, unspecified type   5. Preoperative cardiovascular examination    PLAN:    History of atrial fibrillation - Plan: EKG 12-Lead, ECHOCARDIOGRAM COMPLETE Atrial fibrillation, unspecified type (HCC) - Plan: LONG TERM MONITOR (3-14 DAYS) - complex history without records to support. Will  need to get cardiac monitor and echo to review if she is currently having afib, burden, and rate control given symptoms. We also need an echo to see if there is any congenital heart disease or structural abnormality given young age of onset of afib.   OSA on CPAP - undergoing repeat eval with PCCM.  Chest pain, unspecified type - discussed ischemic testing however without more information on nature of thyroid disease, we will defer CCTA given iodine contrast load. Awaiting endocrine records to better assess. She is low to intermediate probability of CAD, and symptoms seems possible cardiac.  Preoperative cardiovascular examination - cannot comment until testing complete.   Total time  of encounter: 60 minutes total time of encounter, including 40 minutes spent in face-to-face patient care on the date of this encounter. This time includes coordination of care and counseling regarding above mentioned problem list. Remainder of non-face-to-face time involved reviewing chart documents/testing relevant to the patient encounter and documentation in the medical record. I have independently reviewed documentation from medical record.  Cherlynn Kaiser, MD [place]  [facility]    Medication Adjustments/Labs and Tests Ordered: Current medicines are reviewed at length with the patient today.  Concerns regarding medicines are outlined above.   Orders Placed This Encounter  Procedures  . LONG TERM MONITOR (3-14 DAYS)  . EKG 12-Lead  . ECHOCARDIOGRAM COMPLETE    No orders of the defined types were placed in this encounter.   Patient Instructions  Medication Instructions:  No Changes In Medications at this time.  *If you need a refill on your cardiac medications before your next appointment, please call your pharmacy*  Lab Work: None Ordered At This Time.  If you have labs (blood work) drawn today and your tests are completely normal, you will receive your results only by: Marland Kitchen MyChart  Message (if you have MyChart) OR . A paper copy in the mail If you have any lab test that is abnormal or we need to change your treatment, we will call you to review the results.  Testing/Procedures: Your physician has requested that you have an echocardiogram. Echocardiography is a painless test that uses sound waves to create images of your heart. It provides your doctor with information about the size and shape of your heart and how well your heart's chambers and valves are working. You may receive an ultrasound enhancing agent through an IV if needed to better visualize your heart during the echo.This procedure takes approximately one hour. There are no restrictions for this procedure. This will take place at the [address], [address].   [employer] Cardiac Event Monitor Instructions Your physician has requested you wear your cardiac event monitor for 3 days. [employer] may call or text to confirm a shipping address. The monitor will be sent to a land address via UPS. [employer] will not ship a monitor to a PO BOX. It typically takes 3-5 days to receive your monitor after it has been enrolled. [employer] will assist with USPS tracking if your package is delayed. The telephone number for [employer] is [phone]. Once you have received your monitor, please review the enclosed instructions. Instruction tutorials can also be viewed under help and settings on the enclosed cell phone. Your monitor has already been registered assigning a specific monitor serial # to you.  Applying the monitor Remove cell phone from case and turn it on. The cell phone works as Dealer and needs to be within [employer] of you at all times. The cell phone will need to be charged on a daily basis. We recommend you plug the cell phone into the enclosed charger at your bedside table every night.  Monitor batteries: You will receive two monitor batteries labelled #1 and #2. These are your recorders.  Plug battery #2 onto the second connection on the enclosed charger. Keep one battery on the charger at all times. This will keep the monitor battery deactivated. It will also keep it fully charged for when you need to switch your monitor batteries. A small light will be blinking on the battery emblem when it is charging. The light on the battery emblem will remain on when the  battery is fully charged.  Open package of a Monitor strip. Insert battery #1 into black hood on strip and gently squeeze monitor battery onto connection as indicated in instruction booklet. Set aside while preparing skin.  Choose location for your strip, vertical or horizontal, as indicated in the instruction booklet. Shave to remove all hair from location. There cannot be any lotions, oils, powders, or colognes on skin where monitor is to be applied. Wipe skin clean with enclosed Saline wipe. Dry skin completely.  Peel paper labeled #1 off the back of the Monitor strip exposing the adhesive. Place the monitor on the chest in the vertical or horizontal position shown in the instruction booklet. One arrow on the monitor strip must be pointing upward. Carefully remove paper labeled #2, attaching remainder of strip to your skin. Try not to create any folds or wrinkles in the strip as you apply it.  Firmly press and release the circle in the center of the monitor battery. You will hear a small beep. This is turning the monitor battery on. The heart emblem on the monitor battery will light up every 5 seconds if the monitor battery in turned on and connected to the patient securely. Do not push and hold the circle down as this turns the monitor battery off. The cell phone will locate the monitor battery. A screen will appear on the cell phone checking the connection of your monitor strip. This may read poor connection initially but change to good connection within the next minute. Once your monitor accepts the connection you  will hear a series of 3 beeps followed by a climbing crescendo of beeps. A screen will appear on the cell phone showing the two monitor strip placement options. Touch the picture that demonstrates where you applied the monitor strip.  Your monitor strip and battery are waterproof. You are able to shower, bathe, or swim with the monitor on. They just ask you do not submerge deeper than 3 feet underwater. We recommend removing the monitor if you are swimming in a lake, river, or ocean.  Your monitor battery will need to be switched to a fully charged monitor battery approximately once a week. The cell phone will alert you of an action which needs to be made.  On the cell phone, tap for details to reveal connection status, monitor battery status, and cell phone battery status. The green dots indicates your monitor is in good status. A red dot indicates there is something that needs your attention.  To record a symptom, click the circle on the monitor battery. In 30-60 seconds a list of symptoms will appear on the cell phone. Select your symptom and tap save. Your monitor will record a sustained or significant arrhythmia regardless of you clicking the button. Some patients do not feel the heart rhythm irregularities. [employer] will notify us of any serious or critical events.  Refer to instruction booklet for instructions on switching batteries, changing strips, the Do not disturb or Pause features, or any additional questions.  Call [employer] at [phone], to confirm your monitor is transmitting and record your baseline. They will answer any questions you may have regarding the monitor instructions at that time.  Returning the monitor to [place] all equipment back into blue box. Peel off strip of paper to expose adhesive and close box securely. There is a prepaid UPS shipping label on this box. Drop in a UPS drop box, or at a UPS facility like [employer]. You may also contact  [employer] to arrange UPS to pick up monitor package at your home.   Follow-Up: At [facility], you and your health needs are our priority.  As part of our continuing mission to provide you with exceptional heart care, we have created designated Provider Care Teams.  These Care Teams include your primary Cardiologist (physician) and Advanced Practice Providers (APPs -  Physician Assistants and Nurse Practitioners) who all work together to provide you with the care you need, when you need it.  We recommend signing up for the patient portal called "MyChart".  Sign up information is provided on this After Visit Summary.  MyChart is used to connect with patients for Virtual Visits (Telemedicine).  Patients are able to view lab/test results, encounter notes, upcoming appointments, etc.  Non-urgent messages can be sent to your provider as well.   To learn more about what you can do with MyChart, go to [email or web address].    Your next appointment:   6-8 WEEKS  The format for your next appointment:   In Person  Provider:   Cherlynn Kaiser, MD

## 2020-03-14 NOTE — Patient Instructions (Signed)
Medication Instructions:  No Changes In Medications at this time.  *If you need a refill on your cardiac medications before your next appointment, please call your pharmacy*  Lab Work: None Ordered At This Time.  If you have labs (blood work) drawn today and your tests are completely normal, you will receive your results only by: Marland Kitchen MyChart Message (if you have MyChart) OR . A paper copy in the mail If you have any lab test that is abnormal or we need to change your treatment, we will call you to review the results.  Testing/Procedures: Your physician has requested that you have an echocardiogram. Echocardiography is a painless test that uses sound waves to create images of your heart. It provides your doctor with information about the size and shape of your heart and how well your heart's chambers and valves are working. You may receive an ultrasound enhancing agent through an IV if needed to better visualize your heart during the echo.This procedure takes approximately one hour. There are no restrictions for this procedure. This will take place at the [address], [address].   [employer] Cardiac Event Monitor Instructions Your physician has requested you wear your cardiac event monitor for 3 days. [employer] may call or text to confirm a shipping address. The monitor will be sent to a land address via UPS. [employer] will not ship a monitor to a PO BOX. It typically takes 3-5 days to receive your monitor after it has been enrolled. [employer] will assist with USPS tracking if your package is delayed. The telephone number for [employer] is [phone]. Once you have received your monitor, please review the enclosed instructions. Instruction tutorials can also be viewed under help and settings on the enclosed cell phone. Your monitor has already been registered assigning a specific monitor serial # to you.  Applying the monitor Remove cell phone from case and turn it on. The cell  phone works as Dealer and needs to be within [employer] of you at all times. The cell phone will need to be charged on a daily basis. We recommend you plug the cell phone into the enclosed charger at your bedside table every night.  Monitor batteries: You will receive two monitor batteries labelled #1 and #2. These are your recorders. Plug battery #2 onto the second connection on the enclosed charger. Keep one battery on the charger at all times. This will keep the monitor battery deactivated. It will also keep it fully charged for when you need to switch your monitor batteries. A small light will be blinking on the battery emblem when it is charging. The light on the battery emblem will remain on when the battery is fully charged.  Open package of a Monitor strip. Insert battery #1 into black hood on strip and gently squeeze monitor battery onto connection as indicated in instruction booklet. Set aside while preparing skin.  Choose location for your strip, vertical or horizontal, as indicated in the instruction booklet. Shave to remove all hair from location. There cannot be any lotions, oils, powders, or colognes on skin where monitor is to be applied. Wipe skin clean with enclosed Saline wipe. Dry skin completely.  Peel paper labeled #1 off the back of the Monitor strip exposing the adhesive. Place the monitor on the chest in the vertical or horizontal position shown in the instruction booklet. One arrow on the monitor strip must be pointing upward. Carefully remove paper labeled #2, attaching remainder of strip to your skin.  Try not to create any folds or wrinkles in the strip as you apply it.  Firmly press and release the circle in the center of the monitor battery. You will hear a small beep. This is turning the monitor battery on. The heart emblem on the monitor battery will light up every 5 seconds if the monitor battery in turned on and connected to the patient securely. Do  not push and hold the circle down as this turns the monitor battery off. The cell phone will locate the monitor battery. A screen will appear on the cell phone checking the connection of your monitor strip. This may read poor connection initially but change to good connection within the next minute. Once your monitor accepts the connection you will hear a series of 3 beeps followed by a climbing crescendo of beeps. A screen will appear on the cell phone showing the two monitor strip placement options. Touch the picture that demonstrates where you applied the monitor strip.  Your monitor strip and battery are waterproof. You are able to shower, bathe, or swim with the monitor on. They just ask you do not submerge deeper than 3 feet underwater. We recommend removing the monitor if you are swimming in a lake, river, or ocean.  Your monitor battery will need to be switched to a fully charged monitor battery approximately once a week. The cell phone will alert you of an action which needs to be made.  On the cell phone, tap for details to reveal connection status, monitor battery status, and cell phone battery status. The green dots indicates your monitor is in good status. A red dot indicates there is something that needs your attention.  To record a symptom, click the circle on the monitor battery. In 30-60 seconds a list of symptoms will appear on the cell phone. Select your symptom and tap save. Your monitor will record a sustained or significant arrhythmia regardless of you clicking the button. Some patients do not feel the heart rhythm irregularities. [employer] will notify us of any serious or critical events.  Refer to instruction booklet for instructions on switching batteries, changing strips, the Do not disturb or Pause features, or any additional questions.  Call [employer] at [phone], to confirm your monitor is transmitting and record your baseline. They will answer any  questions you may have regarding the monitor instructions at that time.  Returning the monitor to [place] all equipment back into blue box. Peel off strip of paper to expose adhesive and close box securely. There is a prepaid UPS shipping label on this box. Drop in a UPS drop box, or at a UPS facility like [employer]. You may also contact [employer] to arrange UPS to pick up monitor package at your home.   Follow-Up: At [facility], you and your health needs are our priority.  As part of our continuing mission to provide you with exceptional heart care, we have created designated Provider Care Teams.  These Care Teams include your primary Cardiologist (physician) and Advanced Practice Providers (APPs -  Physician Assistants and Nurse Practitioners) who all work together to provide you with the care you need, when you need it.  We recommend signing up for the patient portal called "MyChart".  Sign up information is provided on this After Visit Summary.  MyChart is used to connect with patients for Virtual Visits (Telemedicine).  Patients are able to view lab/test results, encounter notes, upcoming appointments, etc.  Non-urgent messages can be sent to your  provider as well.   To learn more about what you can do with MyChart, go to [email or web address].    Your next appointment:   6-8 WEEKS  The format for your next appointment:   In Person  Provider:   Cherlynn Kaiser, MD

## 2020-03-19 ENCOUNTER — Telehealth: Payer: Self-pay | Admitting: Pulmonary Disease

## 2020-03-19 ENCOUNTER — Other Ambulatory Visit: Payer: Self-pay | Admitting: Surgery

## 2020-03-19 DIAGNOSIS — G4733 Obstructive sleep apnea (adult) (pediatric): Secondary | ICD-10-CM | POA: Diagnosis not present

## 2020-03-19 NOTE — Telephone Encounter (Signed)
Minimal OSA noted on home sleep test. Does not need CPAP therapy since OSA is improved with weight loss

## 2020-03-19 NOTE — Telephone Encounter (Signed)
Called and went over HST result per Dr Elsworth Soho with patient. All questions answered and patient expressed full understanding. Nothing further needed at this time.

## 2020-03-20 ENCOUNTER — Telehealth: Payer: Self-pay

## 2020-03-20 NOTE — Telephone Encounter (Signed)
   [facility] Pre-operative Risk Assessment    HEARTCARE STAFF: - Please ensure there is not already an duplicate clearance open for this procedure. - Under Visit Info/Reason for Call, type in Other and utilize the format Clearance MM/DD/YY or Clearance TBD. Do not use dashes or single digits. - If request is for dental extraction, please clarify the # of teeth to be extracted.  Request for surgical clearance:  1. What type of surgery is being performed? Total Thyroidectomy    2. When is this surgery scheduled? TBD   3. What type of clearance is required (medical clearance vs. Pharmacy clearance to hold med vs. Both)? Medical clearance  4. Are there any medications that need to be held prior to surgery and how long? None taken   5. Practice name and name of physician performing surgery? [facility], [place], Dr. Coralie Keens   6. What is the office phone number? [phone]   7.   What is the office fax number? [phone] Attn: Malachi Bonds, CMA  8.   Anesthesia type (None, local, MAC, general) ? General    Jacqulynn Cadet 03/20/2020, 9:31 AM  _________________________________________________________________   (provider comments below)

## 2020-03-20 NOTE — Telephone Encounter (Signed)
   Primary Cardiologist: Elouise Munroe, MD  Chart reviewed as part of pre-operative protocol coverage.Patient recently established care with Dr. Margaretann Loveless 03/14/20 for the evaluation of paroxsymal atrial fibrillation, generally post-operatively. Looks like preoperative status was possible discussed at that visit.   Dr. Margaretann Loveless, can you comment on the patients risk prior to surgery? Please route your response back to P CV DIV PREOP.  Thank you!  Abigail Butts, PA-C 03/20/2020, 12:10 PM

## 2020-03-27 NOTE — Telephone Encounter (Signed)
   Primary Cardiologist: Elouise Munroe, MD  Chart reviewed as part of pre-operative protocol coverage. Patient was recently seen by Dr. Margaretann Loveless 03/14/20 at which time discussion of upcoming thyroid surgery was mentioned. Note is not yet complete but appears further testing with echo/monitor and follow-up was suggested.   Per office protocol, since patient was seen by provider in preparation for surgery, no separate input is needed from pre-op APP team. Will route back to provider to review clearance and provide final recommendations to requesting provider below. Will send update to requesting surgeon as update that further cardiac testing is planned and remove from preop APP box.   Charlie Pitter, PA-C 03/27/2020, 9:14 AM

## 2020-03-27 NOTE — Telephone Encounter (Signed)
Testing for preop risk stratification is not complete. No comments available for risk at this time.   Please let us know if surgery is urgent and we will risk stratify based on available information.   Frances Munroe, MD

## 2020-03-27 NOTE — Telephone Encounter (Signed)
Reply reviewed, to nurse for review and correspondence with MD. Will remove from pre-op APP box as below as no separate input needed from our team.

## 2020-03-31 ENCOUNTER — Ambulatory Visit (HOSPITAL_COMMUNITY): Payer: Federal, State, Local not specified - PPO | Attending: Cardiology

## 2020-03-31 ENCOUNTER — Other Ambulatory Visit: Payer: Self-pay

## 2020-03-31 DIAGNOSIS — Z8679 Personal history of other diseases of the circulatory system: Secondary | ICD-10-CM | POA: Diagnosis present

## 2020-03-31 DIAGNOSIS — R079 Chest pain, unspecified: Secondary | ICD-10-CM | POA: Insufficient documentation

## 2020-03-31 LAB — ECHOCARDIOGRAM COMPLETE
Area-P 1/2: 2.84 cm2
S' Lateral: 3 cm

## 2020-04-16 NOTE — Patient Instructions (Signed)
DUE TO COVID-19 ONLY TWO  VISITORS ARE  ALLOWED TO COME WITH YOU AND STAY IN THE WAITING ROOM ONLY DURING PRE OP AND PROCEDURE DAY OF SURGERY. TWO VISITOR  MAY VISIT WITH YOU AFTER SURGERY IN YOUR PRIVATE ROOM DURING VISITING HOURS ONLY!  YOU NEED TO HAVE A COVID 19 TEST ON__3-14-22_____ @_______ , THIS TEST MUST BE DONE BEFORE SURGERY,  COVID TESTING SITE [address] [place] [address], IT IS ON THE RIGHT GOING OUT [address] APPROXIMATELY  2 MINUTES PAST [employer] ON THE RIGHT. ONCE YOUR COVID TEST IS COMPLETED,  PLEASE BEGIN THE QUARANTINE INSTRUCTIONS AS OUTLINED IN YOUR HANDOUT.                Frances Davis  04/16/2020   Your procedure is scheduled on: 05-01-20   Report to [facility] Main  Entrance   Report to admitting at        0830  AM     Call this number if you have problems the morning of surgery [phone]    Remember: Do not eat food  :After Midnight. you may have clear liquids until    0730 am then nothing by mouth    CLEAR LIQUID DIET   Foods Allowed                                                                     water Black Coffee and tea, regular and decaf                         Plain Jell-O any favor except red or purple                                           Fruit ices (not with fruit pulp)                                    Iced Popsicles                                     Carbonated beverages, regular and diet                                    Cranberry, grape and apple juices Sports drinks like Gatorade Lightly seasoned clear broth or consume(fat free) Sugar, honey syrup  _____________________________________________________________________       BRUSH YOUR TEETH MORNING OF SURGERY AND RINSE YOUR MOUTH OUT, NO CHEWING GUM CANDY OR MINTS.     Take these medicines the morning of surgery with A SIP OF WATER: valtrex, robaxin, skelaxin,levothyroxine, famotidine, nexium, depakote, valium if needed,  inhaler(bring inhaler with you)  DO NOT TAKE ANY DIABETIC MEDICATIONS DAY OF YOUR SURGERY  You may not have any metal on your body including hair pins and              piercings  Do not wear jewelry, make-up, lotions, powders or perfumes, deodorant             Do not wear nail polish on your fingernails.  Do not shave  48 hours prior to surgery.               Do not bring valuables to the hospital. [place].  Contacts, dentures or bridgework may not be worn into surgery.      Patients discharged the day of surgery will not be allowed to drive home. IF YOU ARE HAVING SURGERY AND GOING HOME THE SAME DAY, YOU MUST HAVE AN ADULT TO DRIVE YOU HOME AND BE WITH YOU FOR 24 HOURS. YOU MAY GO HOME BY TAXI OR UBER OR ORTHERWISE, BUT AN ADULT MUST ACCOMPANY YOU HOME AND STAY WITH YOU FOR 24 HOURS.  Name and phone number of your driver:  Special Instructions: N/A              Please read over the following fact sheets you were given: _____________________________________________________________________             [facility] - Preparing for Surgery Before surgery, you can play an important role.  Because skin is not sterile, your skin needs to be as free of germs as possible.  You can reduce the number of germs on your skin by washing with CHG (chlorahexidine gluconate) soap before surgery.  CHG is an antiseptic cleaner which kills germs and bonds with the skin to continue killing germs even after washing. Please DO NOT use if you have an allergy to CHG or antibacterial soaps.  If your skin becomes reddened/irritated stop using the CHG and inform your nurse when you arrive at Short Stay. Do not shave (including legs and underarms) for at least 48 hours prior to the first CHG shower.  You may shave your face/neck. Please follow these instructions carefully:  1.  Shower with CHG Soap the night before surgery and the   morning of Surgery.  2.  If you choose to wash your hair, wash your hair first as usual with your  normal  shampoo.  3.  After you shampoo, rinse your hair and body thoroughly to remove the  shampoo.                           4.  Use CHG as you would any other liquid soap.  You can apply chg directly  to the skin and wash                       Gently with a scrungie or clean washcloth.  5.  Apply the CHG Soap to your body ONLY FROM THE NECK DOWN.   Do not use on face/ open                           Wound or open sores. Avoid contact with eyes, ears mouth and genitals (private parts).                       Wash face,  Genitals (private parts) with  your normal soap.             6.  Wash thoroughly, paying special attention to the area where your surgery  will be performed.  7.  Thoroughly rinse your body with warm water from the neck down.  8.  DO NOT shower/wash with your normal soap after using and rinsing off  the CHG Soap.                9.  Pat yourself dry with a clean towel.            10.  Wear clean pajamas.            11.  Place clean sheets on your bed the night of your first shower and do not  sleep with pets. Day of Surgery : Do not apply any lotions/deodorants the morning of surgery.  Please wear clean clothes to the hospital/surgery center.  FAILURE TO FOLLOW THESE INSTRUCTIONS MAY RESULT IN THE CANCELLATION OF YOUR SURGERY PATIENT SIGNATURE_________________________________  NURSE SIGNATURE__________________________________  ________________________________________________________________________

## 2020-04-16 NOTE — Progress Notes (Addendum)
   PCP -Dr.  Ashby Dawes  Cardiologist - Cherlynn Kaiser, MD  PPM/ICD -  Device Orders -  Rep Notified -   Chest x-ray - 06-13-19 epic EKG - 03-14-20 epic Stress Test -  ECHO - 03-31-20 epic Cardiac Cath -   Sleep Study -  CPAP -   Fasting Blood Sugar -  Checks Blood Sugar _____ times a day  Blood Thinner Instructions: Aspirin Instructions:  ERAS Protcol - PRE-SURGERY Ensure or G2-   COVID TEST- 3-14  Activity- able to walk a light of stairs without SOB  Anesthesia review: Afib post surgery,OSA  No cpap since weight loss, PTSD, DM  Patient denies shortness of breath, fever, cough and chest pain at PAT appointment   All instructions explained to the patient, with a verbal understanding of the material. Patient agrees to go over the instructions while at home for a better understanding. Patient also instructed to self quarantine after being tested for COVID-19. The opportunity to ask questions was provided.

## 2020-04-23 ENCOUNTER — Ambulatory Visit (HOSPITAL_COMMUNITY)
Admission: RE | Admit: 2020-04-23 | Discharge: 2020-04-23 | Disposition: A | Discharge status: Home / Self Care | Payer: Federal, State, Local not specified - PPO | Source: Ambulatory Visit | Attending: Anesthesiology | Admitting: Anesthesiology

## 2020-04-23 ENCOUNTER — Encounter (HOSPITAL_COMMUNITY): Payer: Self-pay

## 2020-04-23 ENCOUNTER — Other Ambulatory Visit: Payer: Self-pay

## 2020-04-23 ENCOUNTER — Encounter (HOSPITAL_COMMUNITY)
Admission: RE | Admit: 2020-04-23 | Discharge: 2020-04-23 | Disposition: A | Discharge status: Home / Self Care | Payer: Federal, State, Local not specified - PPO | Source: Ambulatory Visit | Attending: Surgery | Admitting: Surgery

## 2020-04-23 DIAGNOSIS — Z79899 Other long term (current) drug therapy: Secondary | ICD-10-CM | POA: Diagnosis not present

## 2020-04-23 DIAGNOSIS — I4891 Unspecified atrial fibrillation: Secondary | ICD-10-CM | POA: Insufficient documentation

## 2020-04-23 DIAGNOSIS — G4730 Sleep apnea, unspecified: Secondary | ICD-10-CM | POA: Diagnosis not present

## 2020-04-23 DIAGNOSIS — E119 Type 2 diabetes mellitus without complications: Secondary | ICD-10-CM | POA: Insufficient documentation

## 2020-04-23 DIAGNOSIS — Z01818 Encounter for other preprocedural examination: Secondary | ICD-10-CM | POA: Insufficient documentation

## 2020-04-23 DIAGNOSIS — E079 Disorder of thyroid, unspecified: Secondary | ICD-10-CM | POA: Insufficient documentation

## 2020-04-23 DIAGNOSIS — Z79890 Hormone replacement therapy (postmenopausal): Secondary | ICD-10-CM | POA: Diagnosis not present

## 2020-04-23 HISTORY — DX: Other complications of anesthesia, initial encounter: T88.59XA

## 2020-04-23 HISTORY — DX: Cardiac murmur, unspecified: R01.1

## 2020-04-23 HISTORY — DX: Type 2 diabetes mellitus without complications: E11.9

## 2020-04-23 HISTORY — DX: Pneumonia, unspecified organism: J18.9

## 2020-04-23 LAB — CBC
HCT: 38.4 % (ref 36.0–46.0)
Hemoglobin: 12.7 g/dL (ref 12.0–15.0)
MCH: 29.5 pg (ref 26.0–34.0)
MCHC: 33.1 g/dL (ref 30.0–36.0)
MCV: 89.1 fL (ref 80.0–100.0)
Platelets: 293 10*3/uL (ref 150–400)
RBC: 4.31 MIL/uL (ref 3.87–5.11)
RDW: 12.9 % (ref 11.5–15.5)
WBC: 6.4 10*3/uL (ref 4.0–10.5)
nRBC: 0 % (ref 0.0–0.2)

## 2020-04-23 LAB — BASIC METABOLIC PANEL
Anion gap: 6 (ref 5–15)
BUN: 13 mg/dL (ref 6–20)
CO2: 30 mmol/L (ref 22–32)
Calcium: 9.2 mg/dL (ref 8.9–10.3)
Chloride: 105 mmol/L (ref 98–111)
Creatinine, Ser: 0.8 mg/dL (ref 0.44–1.00)
GFR, Estimated: >60 mL/min (ref >60)
Glucose, Bld: 83 mg/dL (ref 70–99)
Potassium: 4.4 mmol/L (ref 3.5–5.1)
Sodium: 141 mmol/L (ref 135–145)

## 2020-04-23 LAB — HEMOGLOBIN A1C
Hgb A1c MFr Bld: 5 % (ref 4.8–5.6)
Mean Plasma Glucose: 96.8 mg/dL

## 2020-04-23 LAB — GLUCOSE, CAPILLARY: Glucose-Capillary: 70 mg/dL (ref 70–99)

## 2020-04-24 NOTE — Telephone Encounter (Signed)
Results have been sent to patients surgeon by Dr. Margaretann Loveless via Epic Fax Function. Please Echo result note. Patient aware and verbalized understanding.

## 2020-04-24 NOTE — Progress Notes (Signed)
Anesthesia Chart Review   Case: [id number] Date/Time: 05/01/20 1015   Procedure: TOTAL THYROIDECTOMY (N/A ) - ROOM 1 STARTING AT 10:30AM FOR 120 MIN   Anesthesia type: General   Pre-op diagnosis: TYROID DYSFUNCTION   Location: [facility] ROOM 01 / [facility]   Surgeons: Coralie Keens, MD      DISCUSSION:39 y.o. never smoker with h/o GERD, sleep apnea, DM II, a-fib, thyroid dysfunction scheduled for above procedure 05/01/2020 with Dr. Coralie Keens.   Pt last seen by cardiology 03/14/2020. Echo ordered at this visit.    Echo 03/31/2020 with EF 60-65%, no valvular problems. Per cardiologist, Dr. Margaretann Loveless, "Normal echo and cardiac monitor. If still having chest pain may want to consider nuc stress prior to surgery, otherwise if she is feeling well I can proceed to make final preoperative comments." Pt denies chest pain.   Anticipate pt can proceed with planned procedure barring acute status change.   VS: BP 104/66   Pulse 77   Temp 36.9 C (Oral)   Resp 16   Ht 5' (1.524 m)   Wt 64.9 kg   SpO2 100%   BMI 27.93 kg/m   PROVIDERS: Merrilee Seashore, MD is PCP   Cherlynn Kaiser, MD is Cardiologist  LABS: Labs reviewed: Acceptable for surgery. (all labs ordered are listed, but only abnormal results are displayed)  Labs Reviewed  BASIC METABOLIC PANEL  HEMOGLOBIN A1C  CBC  GLUCOSE, CAPILLARY     IMAGES:   EKG: 03/14/2020 Rate 78 bpm  NSR  Incomplete RBBB  CV: Echo 03/31/2020 IMPRESSIONS    1. Left ventricular ejection fraction, by estimation, is 60 to 65%. The  left ventricle has normal function. The left ventricle has no regional  wall motion abnormalities. Left ventricular diastolic parameters were  normal.  2. Right ventricular systolic function is normal. The right ventricular  size is normal.  3. The mitral valve is normal in structure. No evidence of mitral valve  regurgitation. No evidence of mitral stenosis.  4. The aortic valve is normal in structure. Aortic  valve regurgitation is  not visualized. No aortic stenosis is present.  5. The inferior vena cava is normal in size with greater than 50%  respiratory variability, suggesting right atrial pressure of 3 mmHg.  Past Medical History:  Diagnosis Date  . Anxiety   . Arthritis   . Asthma   . Cancer ([place])    cervical  . Cervical cancer ([place])   . Complication of anesthesia    Afib post surgey   . Depression   . Diabetes mellitus without complication ([place])    type 2  . GERD (gastroesophageal reflux disease)   . Heart murmur    born with mummur  . History of carpal tunnel surgery of right wrist   . History of carpal tunnel surgery of right wrist   . Hypothyroidism   . Low blood pressure   . Lumbar herniated disc   . Pneumonia   . PTSD (post-traumatic stress disorder)   . Sciatica   . Sleep apnea    no cpap after weight loss   . Thyroid disease    hypothyroidism    Past Surgical History:  Procedure Laterality Date  . ABDOMINAL HYSTERECTOMY    . APPENDECTOMY    . BACK SURGERY     SI joint fusion  . CARPAL TUNNEL RELEASE     bil  . CESAREAN SECTION     x2  . CHOLECYSTECTOMY    . cholesectomy    .  colonscopy     polyps removed  . HIP SURGERY Left    sciatica-related, SI joint surgery  . KNEE SURGERY     x2 arthroscopic  bil  . OOPHORECTOMY      MEDICATIONS: . albuterol (VENTOLIN HFA) 108 (90 Base) MCG/ACT inhaler  . Calcium Carb-Cholecalciferol (CALCIUM 500 +D PO)  . diazepam (VALIUM) 5 MG tablet  . divalproex (DEPAKOTE) 500 MG DR tablet  . esomeprazole (NEXIUM) 40 MG capsule  . estradiol (ESTRACE) 1 MG tablet  . famotidine (PEPCID) 40 MG tablet  . hydrOXYzine (ATARAX/VISTARIL) 25 MG tablet  . levothyroxine (SYNTHROID) 25 MCG tablet  . metaxalone (SKELAXIN) 800 MG tablet  . methocarbamol (ROBAXIN) 750 MG tablet  . Multiple Vitamin (MULTIVITAMIN WITH MINERALS) TABS tablet  . OZEMPIC, 1 MG/DOSE, 4 MG/3ML SOPN  . QUEtiapine (SEROQUEL) 100 MG tablet  . sertraline  (ZOLOFT) 100 MG tablet  . valACYclovir (VALTREX) 500 MG tablet   No current facility-administered medications for this encounter.    Konrad Felix, PA-C [facility] Pre-Surgical Testing [phone]

## 2020-04-28 ENCOUNTER — Other Ambulatory Visit (HOSPITAL_COMMUNITY)
Admission: RE | Admit: 2020-04-28 | Discharge: 2020-04-28 | Disposition: A | Discharge status: Home / Self Care | Payer: Federal, State, Local not specified - PPO | Source: Ambulatory Visit | Attending: Surgery | Admitting: Surgery

## 2020-04-28 DIAGNOSIS — Z20822 Contact with and (suspected) exposure to covid-19: Secondary | ICD-10-CM | POA: Insufficient documentation

## 2020-04-28 DIAGNOSIS — Z01812 Encounter for preprocedural laboratory examination: Secondary | ICD-10-CM | POA: Diagnosis present

## 2020-04-28 LAB — SARS CORONAVIRUS 2 (TAT 6-24 HRS): SARS Coronavirus 2: NEGATIVE

## 2020-04-30 NOTE — Telephone Encounter (Signed)
Please see result note for Echo. Dr. Margaretann Loveless forwarded clearance to Dr. Ninfa Linden. Patient surgery is scheduled for today.

## 2020-04-30 NOTE — H&P (Signed)
Frances Davis  Location: [facility] Patient #: [id number] DOB: [birth year] Married / Language: English / Race: Undefined Female   History of Present Illness  The patient is a 40 year old female who presents for evaluation of a thyroid disorder.  Chief complaint: Thyroid dysfunction  This is a 40 year old female referred by Dr. Garnet Koyanagi for evaluation of thyroid dysfunction. This is been occurring for over 6 months. She will have extreme fluctuations of both hyper and hypothyroidism. I left showing TSH levels as low as 0.01 and as high as 18.11. She has had extensive changes in her weight with weight gain and weight loss. She has had weakness and near-syncopal episodes. She has being seen by cardiologist and currently is wearing a Holter monitor. She has no difficulty swallowing although she does report her tonsils will swell from time to time. I have seen her in the past for a large lymph nodes in her axilla which she reports has completely resolved. She has no visual disturbances. She denies night sweats.   Past Surgical History  Appendectomy  Cesarean Section - Multiple  Colon Polyp Removal - Colonoscopy  Hysterectomy (due to cancer) - Complete  Knee Surgery  Bilateral.  Diagnostic Studies History (Kheana Marshall-McBride, CMA; 03/19/2020 9:59 AM) Colonoscopy  1-5 years ago within last year Mammogram  >3 years ago 1-3 years ago Pap Smear  1-5 years ago  Allergies (Kheana Marshall-McBride, CMA; 03/19/2020 9:59 AM) Shellfish  Lidocaine *CHEMICALS*  Allergies Reconciled   Medication History (Kheana Marshall-McBride, CMA; 03/19/2020 9:59 AM) Celecoxib (200MG  Capsule, Oral) Active. diazePAM (5MG  Tablet, Oral) Active. Divalproex Sodium (500MG  Tablet DR, Oral) Active. Esomeprazole Magnesium (40MG  Capsule DR, Oral) Active. Estradiol (1MG  Tablet, Oral) Active. hydrOXYzine HCl (25MG  Tablet, Oral) Active. Methocarbamol (750MG  Tablet, Oral)  Active. QUEtiapine Fumarate (100MG  Tablet, Oral) Active. Sertraline HCl (100MG  Tablet, Oral) Active. Medications Reconciled  Social History Hortencia Conradi, CMA; 03/19/2020 9:59 AM) Alcohol use  Occasional alcohol use. Caffeine use  Coffee, Tea. No drug use  Tobacco use  Never smoker.  Family History Hortencia Conradi, CMA; 03/19/2020 9:59 AM) Alcohol Abuse  Father. Arthritis  Mother. Breast Cancer  Family Members In General. Cancer  Family Members In General. Cerebrovascular Accident  Family Members In [place] Members In General. Colon Polyps  Family Members In General, Mother. Diabetes Mellitus  Family Members In General. Heart disease in female family member before age 16  Hypertension  Father. Kidney Disease  Brother. Melanoma  Family Members In General. Migraine Headache  Mother. Prostate Cancer  Family Members In General. Thyroid problems  Mother.  Pregnancy / Birth History Hortencia Conradi, CMA; 03/19/2020 9:59 AM) Age at menarche  27 years. Age of menopause  <47 Contraceptive History  Depo-provera. Gravida  3 2 Irregular periods  Length (months) of breastfeeding  7-12 Maternal age  76-20 Para  2  Other Problems Hortencia Conradi, CMA; 03/19/2020 9:59 AM) Anxiety Disorder  Arthritis  Asthma  Back Pain  Cervical Cancer  Depression  Diabetes Mellitus  Gastroesophageal Reflux Disease  Hemorrhoids  Oophorectomy  Bilateral. Sleep Apnea  Thyroid Disease     Review of Systems [facility] Marshall-McBride CMA;  General Present- Weight Loss. Not Present- Appetite Loss, Chills, Fatigue, Fever, Night Sweats and Weight Gain. Skin Not Present- Change in Wart/Mole, Dryness, Hives, Jaundice, New Lesions, Non-Healing Wounds, Rash and Ulcer. HEENT Not Present- Earache, Hearing Loss, Hoarseness, Nose Bleed, Oral Ulcers, Ringing in the Ears, Seasonal Allergies, Sinus Pain, Sore Throat,  Visual Disturbances, Wears glasses/contact lenses  and Yellow Eyes. Respiratory Present- Snoring. Not Present- Bloody sputum, Chronic Cough, Difficulty Breathing and Wheezing. Breast Not Present- Breast Mass, Breast Pain, Nipple Discharge and Skin Changes. Cardiovascular Present- Rapid Heart Rate and Swelling of Extremities. Not Present- Chest Pain, Difficulty Breathing Lying Down, Leg Cramps, Palpitations and Shortness of Breath. Gastrointestinal Present- Hemorrhoids. Not Present- Abdominal Pain, Bloating, Bloody Stool, Change in Bowel Habits, Chronic diarrhea, Constipation, Difficulty Swallowing, Excessive gas, Gets full quickly at meals, Indigestion, Nausea, Rectal Pain and Vomiting. Female Genitourinary Not Present- Frequency, Nocturia, Painful Urination, Pelvic Pain and Urgency. Musculoskeletal Present- Back Pain, Joint Pain, Joint Stiffness and Muscle Pain. Not Present- Muscle Weakness and Swelling of Extremities. Neurological Present- Trouble walking and Weakness. Not Present- Decreased Memory, Fainting, Headaches, Numbness, Seizures, Tingling and Tremor. Psychiatric Present- Anxiety, Bipolar, Depression and Frequent crying. Not Present- Change in Sleep Pattern and Fearful. Endocrine Present- Hair Changes and Hot flashes. Not Present- Cold Intolerance, Excessive Hunger, Heat Intolerance and New Diabetes. Hematology Present- Easy Bruising. Not Present- Blood Thinners, Excessive bleeding, Gland problems, HIV and Persistent Infections.  Vitals   Weight: 149.25 lb Height: 60in Body Surface Area: 1.65 m Body Mass Index: 29.15 kg/m  Temp.: 97.38F  Pulse: 11 (Regular)  P.OX: 99% (Room air) BP: 112/70(Sitting, Left Arm, Standard     Physical Exam The physical exam findings are as follows: Note: She appeared well on physical examination  Oropharynx is normal  There are no palpable neck masses and no thyroid nodules. There is no cervical adenopathy. Thyroid feels normal in  size    Assessment & Plan  THYROID DYSFUNCTION (E07.9)  Impression: I reviewed her notes in the electronic medical records including her prior thyroid ultrasounds. I am waiting for a few more minutes from her endocrinologist. She has thyroid dysfunction which includes both hyper and hypo thyroidism. Her endocrinologist recommended a total thyroidectomy. The patient would like to proceed with surgery as well. I gave her literature regarding thyroids. We discussed thyroidectomy in detail. We discussed surgical risks which includes there not limited to bleeding, infection, injury to surrounding structures including nerves with permanent changes to the voice, injury to the parathyroid glands with hypocalcemia, the need for long-term thyroid replacement, cardiopulmonary issues, etc. She understands and wished to proceed with surgery. We will need clearance from cardiology as she is currently being monitored with a Holter monitor regarding her heart.

## 2020-05-01 ENCOUNTER — Observation Stay (HOSPITAL_COMMUNITY)
Admission: RE | Admit: 2020-05-01 | Discharge: 2020-05-02 | Disposition: A | Discharge status: Home / Self Care | Payer: Federal, State, Local not specified - PPO | Source: Home / Self Care | Attending: Surgery | Admitting: Surgery

## 2020-05-01 ENCOUNTER — Encounter (HOSPITAL_COMMUNITY): Payer: Self-pay | Admitting: Surgery

## 2020-05-01 ENCOUNTER — Ambulatory Visit (HOSPITAL_COMMUNITY): Payer: Federal, State, Local not specified - PPO | Admitting: Physician Assistant

## 2020-05-01 ENCOUNTER — Other Ambulatory Visit: Payer: Self-pay

## 2020-05-01 ENCOUNTER — Encounter (HOSPITAL_COMMUNITY)
Admission: RE | Disposition: A | Discharge status: Home / Self Care | Payer: Self-pay | Source: Home / Self Care | Attending: Surgery

## 2020-05-01 ENCOUNTER — Ambulatory Visit (HOSPITAL_COMMUNITY): Payer: Federal, State, Local not specified - PPO | Admitting: Anesthesiology

## 2020-05-01 DIAGNOSIS — Z8541 Personal history of malignant neoplasm of cervix uteri: Secondary | ICD-10-CM | POA: Insufficient documentation

## 2020-05-01 DIAGNOSIS — E079 Disorder of thyroid, unspecified: Secondary | ICD-10-CM | POA: Insufficient documentation

## 2020-05-01 DIAGNOSIS — E890 Postprocedural hypothyroidism: Secondary | ICD-10-CM

## 2020-05-01 DIAGNOSIS — J45909 Unspecified asthma, uncomplicated: Secondary | ICD-10-CM | POA: Insufficient documentation

## 2020-05-01 DIAGNOSIS — E119 Type 2 diabetes mellitus without complications: Secondary | ICD-10-CM | POA: Insufficient documentation

## 2020-05-01 DIAGNOSIS — Z79899 Other long term (current) drug therapy: Secondary | ICD-10-CM | POA: Insufficient documentation

## 2020-05-01 HISTORY — PX: THYROIDECTOMY: SHX17

## 2020-05-01 LAB — BASIC METABOLIC PANEL
Anion gap: 9 (ref 5–15)
BUN: 14 mg/dL (ref 6–20)
CO2: 27 mmol/L (ref 22–32)
Calcium: 8.2 mg/dL — ABNORMAL LOW (ref 8.9–10.3)
Chloride: 102 mmol/L (ref 98–111)
Creatinine, Ser: 0.9 mg/dL (ref 0.44–1.00)
GFR, Estimated: >60 mL/min (ref >60)
Glucose, Bld: 127 mg/dL — ABNORMAL HIGH (ref 70–99)
Potassium: 3.8 mmol/L (ref 3.5–5.1)
Sodium: 138 mmol/L (ref 135–145)

## 2020-05-01 LAB — GLUCOSE, CAPILLARY
Glucose-Capillary: 74 mg/dL (ref 70–99)
Glucose-Capillary: 121 mg/dL — ABNORMAL HIGH (ref 70–99)

## 2020-05-01 SURGERY — THYROIDECTOMY
Anesthesia: General

## 2020-05-01 MED ORDER — FENTANYL CITRATE (PF) 100 MCG/2ML IJ SOLN
INTRAMUSCULAR | Status: DC | PRN
  Administered 2020-05-01 (×3): 50 ug via INTRAVENOUS

## 2020-05-01 MED ORDER — PHENYLEPHRINE 40 MCG/ML (10ML) SYRINGE FOR IV PUSH (FOR BLOOD PRESSURE SUPPORT)
PREFILLED_SYRINGE | INTRAVENOUS | Status: DC | PRN
  Administered 2020-05-01: 120 ug via INTRAVENOUS
  Administered 2020-05-01: 40 ug via INTRAVENOUS

## 2020-05-01 MED ORDER — CEFAZOLIN SODIUM-DEXTROSE 2-4 GM/100ML-% IV SOLN
2.0000 g | INTRAVENOUS | Status: AC
  Administered 2020-05-01: 2 g via INTRAVENOUS
  Filled 2020-05-01: qty 100

## 2020-05-01 MED ORDER — VALACYCLOVIR HCL 500 MG PO TABS
500.0000 mg | ORAL_TABLET | Freq: Every day | ORAL | Status: DC
Start: 2020-05-01 — End: 2020-05-02
  Administered 2020-05-01 – 2020-05-02 (×2): 500 mg via ORAL
  Filled 2020-05-01 (×2): qty 1

## 2020-05-01 MED ORDER — ONDANSETRON HCL 4 MG/2ML IJ SOLN: 4.0000 mg | Freq: Four times a day (QID) | INTRAMUSCULAR | Status: DC | PRN

## 2020-05-01 MED ORDER — PANTOPRAZOLE SODIUM 40 MG PO TBEC
40.0000 mg | DELAYED_RELEASE_TABLET | Freq: Every day | ORAL | Status: DC
  Administered 2020-05-01 – 2020-05-02 (×2): 40 mg via ORAL
  Filled 2020-05-01 (×2): qty 1

## 2020-05-01 MED ORDER — ONDANSETRON HCL 4 MG/2ML IJ SOLN
INTRAMUSCULAR | Status: AC
  Filled 2020-05-01: qty 2

## 2020-05-01 MED ORDER — CHLORHEXIDINE GLUCONATE 0.12 % MT SOLN
15.0000 mL | Freq: Once | OROMUCOSAL | Status: AC
  Administered 2020-05-01: 15 mL via OROMUCOSAL

## 2020-05-01 MED ORDER — ONDANSETRON HCL 4 MG/2ML IJ SOLN
INTRAMUSCULAR | Status: DC | PRN
  Administered 2020-05-01: 4 mg via INTRAVENOUS

## 2020-05-01 MED ORDER — DEXAMETHASONE SODIUM PHOSPHATE 10 MG/ML IJ SOLN
INTRAMUSCULAR | Status: DC | PRN
  Administered 2020-05-01: 6 mg via INTRAVENOUS

## 2020-05-01 MED ORDER — FENTANYL CITRATE (PF) 100 MCG/2ML IJ SOLN
25.0000 ug | INTRAMUSCULAR | Status: DC | PRN
Start: 2020-05-01 — End: 2020-05-01
  Administered 2020-05-01 (×2): 50 ug via INTRAVENOUS

## 2020-05-01 MED ORDER — FENTANYL CITRATE (PF) 100 MCG/2ML IJ SOLN
INTRAMUSCULAR | Status: AC
  Filled 2020-05-01: qty 2

## 2020-05-01 MED ORDER — ENSURE PRE-SURGERY PO LIQD
296.0000 mL | Freq: Once | ORAL | Status: DC
  Filled 2020-05-01: qty 296

## 2020-05-01 MED ORDER — DIVALPROEX SODIUM 250 MG PO DR TAB
500.0000 mg | DELAYED_RELEASE_TABLET | Freq: Every day | ORAL | Status: DC
Start: 2020-05-01 — End: 2020-05-02
  Administered 2020-05-01 – 2020-05-02 (×2): 500 mg via ORAL
  Filled 2020-05-01 (×2): qty 2

## 2020-05-01 MED ORDER — ROCURONIUM BROMIDE 10 MG/ML (PF) SYRINGE
PREFILLED_SYRINGE | INTRAVENOUS | Status: AC
  Filled 2020-05-01: qty 10

## 2020-05-01 MED ORDER — ACETAMINOPHEN 325 MG PO TABS: 325.0000 mg | ORAL_TABLET | ORAL | Status: DC | PRN

## 2020-05-01 MED ORDER — ESTRADIOL 1 MG PO TABS
1.0000 mg | ORAL_TABLET | Freq: Every day | ORAL | Status: DC
  Administered 2020-05-01 – 2020-05-02 (×2): 1 mg via ORAL
  Filled 2020-05-01 (×2): qty 1

## 2020-05-01 MED ORDER — PROPOFOL 10 MG/ML IV BOLUS
INTRAVENOUS | Status: DC | PRN
  Administered 2020-05-01: 150 mg via INTRAVENOUS

## 2020-05-01 MED ORDER — CHLORHEXIDINE GLUCONATE CLOTH 2 % EX PADS: 6.0000 | MEDICATED_PAD | Freq: Once | CUTANEOUS | Status: DC

## 2020-05-01 MED ORDER — OXYCODONE HCL 5 MG/5ML PO SOLN: 5.0000 mg | Freq: Once | ORAL | Status: DC | PRN

## 2020-05-01 MED ORDER — ORAL CARE MOUTH RINSE: 15.0000 mL | Freq: Once | OROMUCOSAL | Status: AC

## 2020-05-01 MED ORDER — MORPHINE SULFATE (PF) 2 MG/ML IV SOLN
1.0000 mg | INTRAVENOUS | Status: DC | PRN
  Administered 2020-05-01: 2 mg via INTRAVENOUS
  Filled 2020-05-01: qty 1

## 2020-05-01 MED ORDER — SUGAMMADEX SODIUM 200 MG/2ML IV SOLN
INTRAVENOUS | Status: DC | PRN
  Administered 2020-05-01: 120 mg via INTRAVENOUS

## 2020-05-01 MED ORDER — ALBUTEROL SULFATE (2.5 MG/3ML) 0.083% IN NEBU: 3.0000 mL | INHALATION_SOLUTION | Freq: Four times a day (QID) | RESPIRATORY_TRACT | Status: DC | PRN

## 2020-05-01 MED ORDER — MIDAZOLAM HCL 2 MG/2ML IJ SOLN
INTRAMUSCULAR | Status: DC | PRN
  Administered 2020-05-01: 2 mg via INTRAVENOUS

## 2020-05-01 MED ORDER — ONDANSETRON HCL 4 MG/2ML IJ SOLN: 4.0000 mg | Freq: Once | INTRAMUSCULAR | Status: DC | PRN

## 2020-05-01 MED ORDER — ROCURONIUM BROMIDE 10 MG/ML (PF) SYRINGE
PREFILLED_SYRINGE | INTRAVENOUS | Status: DC | PRN
  Administered 2020-05-01: 10 mg via INTRAVENOUS
  Administered 2020-05-01: 50 mg via INTRAVENOUS

## 2020-05-01 MED ORDER — ENOXAPARIN SODIUM 40 MG/0.4ML ~~LOC~~ SOLN
40.0000 mg | SUBCUTANEOUS | Status: DC
  Administered 2020-05-02: 40 mg via SUBCUTANEOUS
  Filled 2020-05-01: qty 0.4

## 2020-05-01 MED ORDER — ACETAMINOPHEN 500 MG PO TABS
1000.0000 mg | ORAL_TABLET | ORAL | Status: AC
  Administered 2020-05-01: 1000 mg via ORAL
  Filled 2020-05-01: qty 2

## 2020-05-01 MED ORDER — ACETAMINOPHEN 500 MG PO TABS
1000.0000 mg | ORAL_TABLET | Freq: Four times a day (QID) | ORAL | Status: DC
  Administered 2020-05-01 – 2020-05-02 (×2): 1000 mg via ORAL
  Filled 2020-05-01 (×2): qty 2

## 2020-05-01 MED ORDER — MIDAZOLAM HCL 2 MG/2ML IJ SOLN
INTRAMUSCULAR | Status: AC
  Filled 2020-05-01: qty 2

## 2020-05-01 MED ORDER — OXYCODONE HCL 5 MG PO TABS
5.0000 mg | ORAL_TABLET | ORAL | Status: DC | PRN
  Administered 2020-05-01: 10 mg via ORAL
  Filled 2020-05-01: qty 2

## 2020-05-01 MED ORDER — ONDANSETRON 4 MG PO TBDP: 4.0000 mg | ORAL_TABLET | Freq: Four times a day (QID) | ORAL | Status: DC | PRN

## 2020-05-01 MED ORDER — ACETAMINOPHEN 160 MG/5ML PO SOLN: 325.0000 mg | ORAL | Status: DC | PRN

## 2020-05-01 MED ORDER — OXYCODONE HCL 5 MG PO TABS: 5.0000 mg | ORAL_TABLET | Freq: Once | ORAL | Status: DC | PRN

## 2020-05-01 MED ORDER — METAXALONE 800 MG PO TABS
800.0000 mg | ORAL_TABLET | Freq: Three times a day (TID) | ORAL | Status: DC
  Administered 2020-05-01 – 2020-05-02 (×2): 800 mg via ORAL
  Filled 2020-05-01 (×4): qty 1

## 2020-05-01 MED ORDER — TRAMADOL HCL 50 MG PO TABS
50.0000 mg | ORAL_TABLET | Freq: Four times a day (QID) | ORAL | Status: DC | PRN
  Administered 2020-05-01: 50 mg via ORAL
  Filled 2020-05-01: qty 1

## 2020-05-01 MED ORDER — LACTATED RINGERS IV SOLN: INTRAVENOUS | Status: DC

## 2020-05-01 MED ORDER — DIPHENHYDRAMINE HCL 50 MG/ML IJ SOLN: 25.0000 mg | Freq: Four times a day (QID) | INTRAMUSCULAR | Status: DC | PRN

## 2020-05-01 MED ORDER — METHOCARBAMOL 500 MG PO TABS
750.0000 mg | ORAL_TABLET | Freq: Two times a day (BID) | ORAL | Status: DC
  Administered 2020-05-01 – 2020-05-02 (×2): 750 mg via ORAL
  Filled 2020-05-01 (×2): qty 2

## 2020-05-01 MED ORDER — QUETIAPINE FUMARATE 50 MG PO TABS
100.0000 mg | ORAL_TABLET | Freq: Every day | ORAL | Status: DC
  Administered 2020-05-01: 100 mg via ORAL
  Filled 2020-05-01: qty 2

## 2020-05-01 MED ORDER — SERTRALINE HCL 100 MG PO TABS: 100.0000 mg | ORAL_TABLET | Freq: Every day | ORAL | Status: DC

## 2020-05-01 MED ORDER — POTASSIUM CHLORIDE IN NACL 20-0.9 MEQ/L-% IV SOLN
INTRAVENOUS | Status: DC
  Filled 2020-05-01 (×2): qty 1000

## 2020-05-01 MED ORDER — GABAPENTIN 300 MG PO CAPS
300.0000 mg | ORAL_CAPSULE | ORAL | Status: AC
  Administered 2020-05-01: 300 mg via ORAL
  Filled 2020-05-01: qty 1

## 2020-05-01 MED ORDER — MEPERIDINE HCL 50 MG/ML IJ SOLN: 6.2500 mg | INTRAMUSCULAR | Status: DC | PRN

## 2020-05-01 MED ORDER — DIAZEPAM 5 MG PO TABS: 5.0000 mg | ORAL_TABLET | Freq: Two times a day (BID) | ORAL | Status: DC | PRN

## 2020-05-01 MED ORDER — PROPOFOL 10 MG/ML IV BOLUS
INTRAVENOUS | Status: AC
  Filled 2020-05-01: qty 20

## 2020-05-01 MED ORDER — DEXAMETHASONE SODIUM PHOSPHATE 10 MG/ML IJ SOLN
INTRAMUSCULAR | Status: AC
  Filled 2020-05-01: qty 1

## 2020-05-01 MED ORDER — DIPHENHYDRAMINE HCL 25 MG PO CAPS: 25.0000 mg | ORAL_CAPSULE | Freq: Four times a day (QID) | ORAL | Status: DC | PRN

## 2020-05-01 MED ORDER — HYDROXYZINE HCL 25 MG PO TABS
25.0000 mg | ORAL_TABLET | Freq: Every day | ORAL | Status: DC
  Administered 2020-05-01: 25 mg via ORAL
  Filled 2020-05-01: qty 1

## 2020-05-01 MED ORDER — 0.9 % SODIUM CHLORIDE (POUR BTL) OPTIME
TOPICAL | Status: DC | PRN
Start: 2020-05-01 — End: 2020-05-01
  Administered 2020-05-01: 1000 mL

## 2020-05-01 SURGICAL SUPPLY — 42 items
ADH SKN CLS APL DERMABOND .7 (GAUZE/BANDAGES/DRESSINGS) ×1
APL SKNCLS STERI-STRIP NONHPOA (GAUZE/BANDAGES/DRESSINGS) ×1
ATTRACTOMAT 16X20 MAGNETIC DRP (DRAPES) ×2 IMPLANT
BENZOIN TINCTURE PRP APPL 2/3 (GAUZE/BANDAGES/DRESSINGS) ×2 IMPLANT
BLADE HEX COATED 2.75 (ELECTRODE) ×2 IMPLANT
BLADE SURG 15 STRL LF DISP TIS (BLADE) ×1 IMPLANT
BLADE SURG 15 STRL SS (BLADE) ×2
BLADE SURG SZ10 CARB STEEL (BLADE) IMPLANT
CLIP VESOCCLUDE SM WIDE 6/CT (CLIP) ×12 IMPLANT
COVER WAND RF STERILE (DRAPES) IMPLANT
DERMABOND ADVANCED (GAUZE/BANDAGES/DRESSINGS) ×1
DERMABOND ADVANCED .7 DNX12 (GAUZE/BANDAGES/DRESSINGS) ×1 IMPLANT
DISSECTOR ROUND CHERRY 3/8 STR (MISCELLANEOUS) IMPLANT
DRAPE LAPAROTOMY T 98X78 PEDS (DRAPES) ×2 IMPLANT
DRAPE UTILITY XL STRL (DRAPES) ×2 IMPLANT
ELECT PENCIL ROCKER SW 15FT (MISCELLANEOUS) ×2 IMPLANT
ELECT REM PT RETURN 15FT ADLT (MISCELLANEOUS) ×2 IMPLANT
GAUZE 4X4 16PLY RFD (DISPOSABLE) ×2 IMPLANT
GAUZE SPONGE 4X4 12PLY STRL (GAUZE/BANDAGES/DRESSINGS) ×2 IMPLANT
GLOVE SURG ENC MOIS LTX SZ7.5 (GLOVE) ×2 IMPLANT
GOWN STRL REUS W/TWL LRG LVL3 (GOWN DISPOSABLE) ×2 IMPLANT
GOWN STRL REUS W/TWL XL LVL3 (GOWN DISPOSABLE) ×4 IMPLANT
HEMOSTAT SURGICEL 2X14 (HEMOSTASIS) ×2 IMPLANT
KIT BASIN OR (CUSTOM PROCEDURE TRAY) ×2 IMPLANT
KIT TURNOVER KIT A (KITS) ×2 IMPLANT
MARKER SKIN DUAL TIP RULER LAB (MISCELLANEOUS) ×2 IMPLANT
PACK BASIC VI WITH GOWN DISP (CUSTOM PROCEDURE TRAY) ×2 IMPLANT
PENCIL SMOKE EVACUATOR (MISCELLANEOUS) IMPLANT
SHEARS HARMONIC 9CM CVD (BLADE) ×2 IMPLANT
STRIP CLOSURE SKIN 1/2X4 (GAUZE/BANDAGES/DRESSINGS) ×2 IMPLANT
SUT MNCRL AB 4-0 PS2 18 (SUTURE) ×2 IMPLANT
SUT SILK 2 0 (SUTURE) ×2
SUT SILK 2-0 18XBRD TIE 12 (SUTURE) ×1 IMPLANT
SUT SILK 3 0 (SUTURE)
SUT SILK 3-0 18XBRD TIE 12 (SUTURE) IMPLANT
SUT VIC AB 3-0 SH 18 (SUTURE) ×4 IMPLANT
SUT VICRYL 2 0 18  UND BR (SUTURE)
SUT VICRYL 2 0 18 UND BR (SUTURE) IMPLANT
SYR BULB IRRIG 60ML STRL (SYRINGE) ×2 IMPLANT
TOWEL OR 17X26 10 PK STRL BLUE (TOWEL DISPOSABLE) ×2 IMPLANT
TUBING CONNECTING 10 (TUBING) ×2 IMPLANT
YANKAUER SUCT BULB TIP 10FT TU (MISCELLANEOUS) ×2 IMPLANT

## 2020-05-01 NOTE — Interval H&P Note (Signed)
History and Physical Interval Note: no change in H and P  05/01/2020 8:44 AM  Frances Davis  has presented today for surgery, with the diagnosis of TYROID DYSFUNCTION.  The various methods of treatment have been discussed with the patient and family. After consideration of risks, benefits and other options for treatment, the patient has consented to  Procedure(s) with comments: TOTAL THYROIDECTOMY (N/A) - ROOM 1 STARTING AT 10:30AM FOR 120 MIN as a surgical intervention.  The patient's history has been reviewed, patient examined, no change in status, stable for surgery.  I have reviewed the patient's chart and labs.  Questions were answered to the patient's satisfaction.     Coralie Keens

## 2020-05-01 NOTE — Anesthesia Postprocedure Evaluation (Signed)
Anesthesia Post Note  Patient: Public librarian  Procedure(s) Performed: TOTAL THYROIDECTOMY (N/A )     Patient location during evaluation: PACU Anesthesia Type: General Level of consciousness: awake and alert Pain management: pain level controlled Vital Signs Assessment: post-procedure vital signs reviewed and stable Respiratory status: spontaneous breathing, nonlabored ventilation, respiratory function stable and patient connected to nasal cannula oxygen Cardiovascular status: blood pressure returned to baseline and stable Postop Assessment: no apparent nausea or vomiting Anesthetic complications: no   No complications documented.  Last Vitals:  Vitals:   05/01/20 0817  BP: 121/75  Pulse: 86  Resp: 15  Temp: 36.9 C  SpO2: 100%    Last Pain:  Vitals:   05/01/20 0830  TempSrc:   PainSc: 0-No pain                 Currie Dennin

## 2020-05-01 NOTE — Anesthesia Preprocedure Evaluation (Addendum)
Anesthesia Evaluation  Patient identified by MRN, date of birth, ID band Patient awake    Reviewed: Allergy & Precautions, H&P , NPO status , Patient's Chart, lab work & pertinent test results, reviewed documented beta blocker date and time   History of Anesthesia Complications (+) history of anesthetic complications  Airway Mallampati: I  TM Distance: >3 FB Neck ROM: full    Dental no notable dental hx. (+) Teeth Intact, Dental Advisory Given   Pulmonary asthma ,    Pulmonary exam normal breath sounds clear to auscultation       Cardiovascular Exercise Tolerance: Good negative cardio ROS   Rhythm:regular Rate:Normal     Neuro/Psych PSYCHIATRIC DISORDERS Anxiety Depression  Neuromuscular disease    GI/Hepatic Neg liver ROS, GERD  Medicated and Controlled,  Endo/Other  diabetes, Oral Hypoglycemic AgentsHypothyroidism   Renal/GU negative Renal ROS  negative genitourinary   Musculoskeletal  (+) Arthritis , Osteoarthritis,    Abdominal   Peds  Hematology negative hematology ROS (+)   Anesthesia Other Findings   Reproductive/Obstetrics negative OB ROS                            Anesthesia Physical Anesthesia Plan  ASA: II  Anesthesia Plan: General   Post-op Pain Management:    Induction:   PONV Risk Score and Plan: 3 and Dexamethasone and Midazolam  Airway Management Planned: Oral ETT  Additional Equipment: None  Intra-op Plan:   Post-operative Plan:   Informed Consent: I have reviewed the patients History and Physical, chart, labs and discussed the procedure including the risks, benefits and alternatives for the proposed anesthesia with the patient or authorized representative who has indicated his/her understanding and acceptance.     Dental Advisory Given  Plan Discussed with: CRNA and Anesthesiologist  Anesthesia Plan Comments: (40 y.o. never smoker with h/o GERD,  sleep apnea, DM II, a-fib, thyroid dysfunction scheduled for above procedure 05/01/2020 with Dr. Coralie Keens.   Pt last seen by cardiology 03/14/2020. Echo ordered at this visit.    Echo 03/31/2020 with EF 60-65%, no valvular problems. Per cardiologist, Dr. Margaretann Loveless, "Normal echo and cardiac monitor. If still having chest pain may want to consider nuc stress prior to surgery, otherwise if she is feeling well I can proceed to make final preoperative comments." Pt denies chest pain.  )       Anesthesia Quick Evaluation

## 2020-05-01 NOTE — Transfer of Care (Signed)
Immediate Anesthesia Transfer of Care Note  Patient: Frances Davis  Procedure(s) Performed: TOTAL THYROIDECTOMY (N/A )  Patient Location: PACU  Anesthesia Type:General  Level of Consciousness: awake  Airway & Oxygen Therapy: Patient Spontanous Breathing and Patient connected to face mask oxygen  Post-op Assessment: Report given to RN, Post -op Vital signs reviewed and stable and Patient moving all extremities X 4  Post vital signs: Reviewed and stable  Last Vitals:  Vitals Value Taken Time  BP    Temp    Pulse    Resp    SpO2      Last Pain:  Vitals:   05/01/20 0830  TempSrc:   PainSc: 0-No pain      Patients Stated Pain Goal: 3 (19/50/93 2671)  Complications: No complications documented.

## 2020-05-01 NOTE — Op Note (Signed)
TOTAL THYROIDECTOMY  Procedure Note  Frances Davis 05/01/2020   Pre-op Diagnosis: ThYROID DYSFUNCTION     Post-op Diagnosis: same  Procedure(s): TOTAL THYROIDECTOMY  Surgeon(s): Coralie Keens, MD   Assistant: Jerilee Hoh, MD  Anesthesia: General  Staff:  Circulator: Anderson Malta, RN Scrub Person: Pollie Meyer, RN; Mammie Lorenzo  Estimated Blood Loss: Minimal               Specimens: sent to path  Indications: This is a 40 year old female with significant thyroid dysfunction who is followed by endocrinology.  Secondary to her dysfunction, endocrinology requested total thyroidectomy.  They have discussed this in detail with the patient as well who agrees with the plans.  Procedure:  The patient brought the operating room identifies correct patient.  She is placed prone on the operating table general anesthesia was induced.  Her neck and chest were then prepped and draped in usual sterile fashion.  A transverse incision was then made across the lower neck scalpel.  This was taken down through the process with electrocautery.  The midline was then identified and opened superior to inferior with cautery.  The underlying strap muscles were identified and dissected laterally.  We turned our attention first to the left thyroid gland.  Staying right on the gland was dissected gland up out of the neck.  The middle vein was identified and clipped and cut with harmonic scalpel.  We identified the lower and upper pole vasculature.  These were controlled with surgical clips as well as 2-0 silk ties and the amount scalpel as well.  We identified what appeared to be a superior and inferior parathyroid gland.  The recurrent laryngeal nerve was also easily identified.  Again we stayed close to the gland dissecting it out of the left side of the neck.  Thyroid artery was identified and clipped and transected as well.  There were no palpable nodules on the side of the gland.  We then  continued dissection toward the midportion of the trachea.  Next we turned our attention toward the right side of the gland.  The patient had a moderate sized nodule right in the central lateral portion of the gland.  This felt fairly fixated.  We dissected out the inferior and superior poles of the thyroid gland and were able control these with silk ties as well as surgical clips in the MRI scalpel.  The gland and nodule seen most fixated at the area of the recurrent laryngeal nerve.  We identified the nerve going into the undersurface of the nodule.  We identified the thyroid artery on the right side and clipped and transected as well.  I was unable to completely get the gland to come off of the recurrent radial nerve so a very small section was left over the top of the nerve in order to attempt to prevent injury.  We were then able to completely remove the rest of the thyroid gland off of the trachea with cautery.  I marked the right superior pole of the thyroid gland with a suture.  The gland was sent to pathology for evaluation.  I palpate around the neck and found no adenopathy.  The neck was irrigated with normal saline.  Hemostasis appeared to be achieved.  We left several small pieces of Surgicel both areas of dissection.  At this point the midline was reapproximated with interrupted 3-0 Vicryl sutures.  The platysma was then closed interrupted 3-0 Vicryl sutures and the skin was closed with  running 4-0 Monocryl.  Dermabond was then applied.  The patient tolerated procedure well.  All counts were correct at the end of the procedure.  The patient was then extubated in the operating room and taken in stable addition to the recovery room.          Coralie Keens   Date: 05/01/2020  Time: 12:45 PM

## 2020-05-01 NOTE — Anesthesia Procedure Notes (Signed)
Procedure Name: Intubation Date/Time: 05/01/2020 10:33 AM Performed by: Niel Hummer, CRNA Pre-anesthesia Checklist: Patient identified, Emergency Drugs available, Suction available and Patient being monitored Oxygen Delivery Method: Circle system utilized Preoxygenation: Pre-oxygenation with 100% oxygen Induction Type: IV induction Ventilation: Mask ventilation without difficulty Laryngoscope Size: Mac and 3 Grade View: Grade I Tube type: Oral Tube size: 7.0 mm Number of attempts: 1 Airway Equipment and Method: Stylet Placement Confirmation: ETT inserted through vocal cords under direct vision Secured at: 21 cm Tube secured with: Tape Dental Injury: Teeth and Oropharynx as per pre-operative assessment

## 2020-05-02 ENCOUNTER — Encounter (HOSPITAL_COMMUNITY): Payer: Self-pay | Admitting: Surgery

## 2020-05-02 LAB — BASIC METABOLIC PANEL
Anion gap: 7 (ref 5–15)
BUN: 12 mg/dL (ref 6–20)
CO2: 26 mmol/L (ref 22–32)
Calcium: 7.1 mg/dL — ABNORMAL LOW (ref 8.9–10.3)
Chloride: 105 mmol/L (ref 98–111)
Creatinine, Ser: 0.8 mg/dL (ref 0.44–1.00)
GFR, Estimated: >60 mL/min (ref >60)
Glucose, Bld: 89 mg/dL (ref 70–99)
Potassium: 4.4 mmol/L (ref 3.5–5.1)
Sodium: 138 mmol/L (ref 135–145)

## 2020-05-02 MED ORDER — TRAMADOL HCL 50 MG PO TABS: 50.0000 mg | ORAL_TABLET | Freq: Four times a day (QID) | ORAL | 0 refills | Status: AC | PRN

## 2020-05-02 MED ORDER — CALCIUM CARB-CHOLECALCIFEROL 500-400 MG-UNIT PO TABS: 2.0000 | ORAL_TABLET | Freq: Three times a day (TID) | ORAL | 1 refills | Status: AC

## 2020-05-02 MED ORDER — LEVOTHYROXINE SODIUM 100 MCG PO TABS: 100.0000 ug | ORAL_TABLET | Freq: Every day | ORAL | 11 refills | Status: AC

## 2020-05-02 NOTE — Discharge Instructions (Signed)
[facility], [place] [phone]  THYROID/ PARATHYROID SURGERY: POST OP INSTRUCTIONS  Always review your discharge instruction sheet given to you by the facility where your surgery was performed.  IF YOU HAVE DISABILITY OR FAMILY LEAVE FORMS, YOU MUST BRING THEM TO THE OFFICE FOR PROCESSING.  PLEASE DO NOT GIVE THEM TO YOUR DOCTOR.  1. A prescription for pain medication may be given to you upon discharge.  Take your pain medication as prescribed, if needed.  If narcotic pain medicine is not needed, then you may take acetaminophen (Tylenol) or ibuprofen (Advil) as needed. 2. Take your usually prescribed medications unless otherwise directed. 3. If you need a refill on your pain medication, please contact your pharmacy. They will contact our office to request authorization.  Prescriptions will not be filled after 5pm or on week-ends. 4. You should follow a light diet the first 24 hours after arrival home, such as soup and crackers, etc.  Be sure to include lots of fluids daily.  Resume your normal diet the day after surgery. 5. Most patients will experience some swelling and bruising on the chest and neck area.  Ice packs will help.  Swelling and bruising can take several days to resolve.  6. It is common to experience some constipation if taking pain medication after surgery.  Increasing fluid intake and taking a stool softener will usually help or prevent this problem from occurring.  A mild laxative (Milk of Magnesia or Miralax) should be taken according to package directions if there are no bowel movements after 48 hours. 7. Unless discharge instructions indicate otherwise, you may remove your bandages 24-48 hours after surgery, and you may shower at that time.  You may have steri-strips (small skin tapes) in place directly over the incision.  These strips should be left on the skin for 7-10 days.  If your surgeon used skin glue on the incision, you may shower in 24 hours.  The  glue will flake off over the next 2-3 weeks.  Any sutures or staples will be removed at the office during your follow-up visit. 8. ACTIVITIES:  You may resume regular (light) daily activities beginning the next day--such as daily self-care, walking, climbing stairs--gradually increasing activities as tolerated.  You may have sexual intercourse when it is comfortable.  Refrain from any heavy lifting or straining until approved by your doctor. a. You may drive when you no longer are taking prescription pain medication, you can comfortably wear a seatbelt, and you can safely maneuver your car and apply brakes b. RETURN TO WORK:  __________________________________________________________ 9. You should see your doctor in the office for a follow-up appointment approximately two weeks after your surgery.  Make sure that you call for this appointment within a day or two after you arrive home to insure a convenient appointment time. 10. OTHER INSTRUCTIONS: _OK TO SHOWER STARTING TODAY 11. ICE PACK, TYLENOL, AND IBUPROFEN ALSO FOR PAIN 12. TAKE CALCIUM 2 PILLS 3 TIMES DAILY___________________________________________________________________________ _________________________________________________________________________________________________________________ _________________________________________________________________________________________________________________   WHEN TO CALL YOUR DOCTOR: 1. Fever over 101.0 2. Inability to urinate 3. Nausea and/or vomiting 4. Extreme swelling or bruising 5. Continued bleeding from incision. 6. Increased pain, redness, or drainage from the incision. 7. Difficulty swallowing or breathing 8. Muscle cramping or spasms. 9. Numbness or tingling in hands or feet or around lips.  The clinic staff is available to answer your questions during regular business hours.  Please don't hesitate to call and ask to speak to  one of the nurses if you have concerns.  For further  questions, please visit [email or web address]

## 2020-05-02 NOTE — Progress Notes (Signed)
Patient was given discharge instructions, and all questions were answered.  Patient was stable for discharge and was taken to the main exit by wheelchair. 

## 2020-05-02 NOTE — Discharge Summary (Signed)
Physician Discharge Summary  Patient ID: Frances Davis MRN: [id number] DOB/AGE: 1980-05-17 40 y.o.  Admit date: 05/01/2020 Discharge date: 05/02/2020  Admission Diagnoses:  Discharge Diagnoses:  Active Problems:   [name] total thyroidectomy THYROID DYSFUNCTION  Discharged Condition: good  Hospital Course: uneventful post op recovery.  Calcium is decreasing and patient will stay on calcium at discharge. Discharged home POD#1  Consults: None  Significant Diagnostic Studies:   Treatments: surgery: total thyroidectomy  Discharge Exam: Blood pressure (!) 95/55, pulse 79, temperature 97.8 F (36.6 C), temperature source Oral, resp. rate 16, height 5' (1.524 m), weight 64.9 kg, SpO2 100 %. General appearance: alert, cooperative and no distress Resp: clear to auscultation bilaterally Cardio: regular rate and rhythm, S1, S2 normal, no murmur, click, rub or gallop Incision/Wound: neck incision clean, no swelling or hematoma, voice normal   Disposition: Discharge disposition: 01-Home or Self Care        Allergies as of 05/02/2020      Reactions   Shellfish Allergy Anaphylaxis   Reaction to shrimp only   Lidocaine Hives      Medication List    TAKE these medications   albuterol 108 (90 Base) MCG/ACT inhaler Commonly known as: VENTOLIN HFA Inhale 1-2 puffs into the lungs every 6 (six) hours as needed for wheezing or shortness of breath.   Calcium Carb-Cholecalciferol 500-400 MG-UNIT Tabs Commonly known as: Calcium 500 +D Take 2 tablets by mouth 3 (three) times daily. What changed:   medication strength  how much to take  when to take this   diazepam 5 MG tablet Commonly known as: VALIUM Take 5 mg by mouth 2 (two) times daily as needed for anxiety.   divalproex 500 MG DR tablet Commonly known as: DEPAKOTE Take 500 mg by mouth daily.   esomeprazole 40 MG capsule Commonly known as: NEXIUM Take 40 mg by mouth daily.   estradiol 1 MG tablet Commonly known as:  ESTRACE Take 1 mg by mouth daily.   famotidine 40 MG tablet Commonly known as: PEPCID Take 40 mg by mouth daily.   hydrOXYzine 25 MG tablet Commonly known as: ATARAX/VISTARIL Take 25 mg by mouth at bedtime.   levothyroxine 25 MCG tablet Commonly known as: SYNTHROID Take 25 mcg by mouth daily before breakfast. What changed: Another medication with the same name was added. Make sure you understand how and when to take each.   levothyroxine 100 MCG tablet Commonly known as: Synthroid Take 1 tablet (100 mcg total) by mouth daily. What changed: You were already taking a medication with the same name, and this prescription was added. Make sure you understand how and when to take each.   metaxalone 800 MG tablet Commonly known as: SKELAXIN Take 1 tablet (800 mg total) by mouth 3 (three) times daily.   methocarbamol 750 MG tablet Commonly known as: ROBAXIN Take 750 mg by mouth 2 (two) times daily.   multivitamin with minerals Tabs tablet Take 1 tablet by mouth daily. Centrum   Ozempic (1 MG/DOSE) 4 MG/3ML Sopn Generic drug: Semaglutide (1 MG/DOSE) Inject 1 mg into the muscle every Wednesday.   QUEtiapine 100 MG tablet Commonly known as: SEROQUEL Take 100 mg by mouth at bedtime.   sertraline 100 MG tablet Commonly known as: ZOLOFT Take 100 mg by mouth at bedtime.   traMADol 50 MG tablet Commonly known as: Ultram Take 1-2 tablets (50-100 mg total) by mouth every 6 (six) hours as needed for moderate pain.   valACYclovir 500 MG tablet Commonly known  as: VALTREX Take 500 mg by mouth daily.       Follow-up Information    Coralie Keens, MD. Schedule an appointment as soon as possible for a visit in 3 week(s).   Specialty: General Surgery Contact information: [address] [place] [address] [phone]               Signed: Coralie Keens 05/02/2020, 7:40 AM

## 2020-05-02 NOTE — Progress Notes (Signed)
Patient ID: Frances Davis, female   DOB: July 17, 1980, 40 y.o.   MRN: [id number]   Doing well No complaints No neck swelling  Voice normal  Ca++ has decreased  Discharge home today on calcium

## 2020-05-03 ENCOUNTER — Encounter (HOSPITAL_COMMUNITY): Payer: Self-pay

## 2020-05-03 ENCOUNTER — Other Ambulatory Visit: Payer: Self-pay

## 2020-05-03 ENCOUNTER — Inpatient Hospital Stay (HOSPITAL_COMMUNITY)
Admission: EM | Admit: 2020-05-03 | Discharge: 2020-05-09 | DRG: 626 | Disposition: A | Discharge status: Home / Self Care | Payer: Federal, State, Local not specified - PPO | Attending: Internal Medicine | Admitting: Internal Medicine

## 2020-05-03 DIAGNOSIS — E119 Type 2 diabetes mellitus without complications: Secondary | ICD-10-CM | POA: Diagnosis present

## 2020-05-03 DIAGNOSIS — Z79890 Hormone replacement therapy (postmenopausal): Secondary | ICD-10-CM

## 2020-05-03 DIAGNOSIS — F419 Anxiety disorder, unspecified: Secondary | ICD-10-CM | POA: Diagnosis present

## 2020-05-03 DIAGNOSIS — G4733 Obstructive sleep apnea (adult) (pediatric): Secondary | ICD-10-CM | POA: Diagnosis present

## 2020-05-03 DIAGNOSIS — Z8541 Personal history of malignant neoplasm of cervix uteri: Secondary | ICD-10-CM

## 2020-05-03 DIAGNOSIS — E876 Hypokalemia: Secondary | ICD-10-CM | POA: Diagnosis present

## 2020-05-03 DIAGNOSIS — K219 Gastro-esophageal reflux disease without esophagitis: Secondary | ICD-10-CM | POA: Diagnosis present

## 2020-05-03 DIAGNOSIS — Z20822 Contact with and (suspected) exposure to covid-19: Secondary | ICD-10-CM | POA: Diagnosis present

## 2020-05-03 DIAGNOSIS — I959 Hypotension, unspecified: Secondary | ICD-10-CM | POA: Diagnosis present

## 2020-05-03 DIAGNOSIS — Z884 Allergy status to anesthetic agent status: Secondary | ICD-10-CM

## 2020-05-03 DIAGNOSIS — Z833 Family history of diabetes mellitus: Secondary | ICD-10-CM

## 2020-05-03 DIAGNOSIS — F32A Depression, unspecified: Secondary | ICD-10-CM | POA: Diagnosis present

## 2020-05-03 DIAGNOSIS — Z79818 Long term (current) use of other agents affecting estrogen receptors and estrogen levels: Secondary | ICD-10-CM

## 2020-05-03 DIAGNOSIS — D62 Acute posthemorrhagic anemia: Secondary | ICD-10-CM | POA: Diagnosis present

## 2020-05-03 DIAGNOSIS — Z91013 Allergy to seafood: Secondary | ICD-10-CM

## 2020-05-03 DIAGNOSIS — Z791 Long term (current) use of non-steroidal anti-inflammatories (NSAID): Secondary | ICD-10-CM

## 2020-05-03 DIAGNOSIS — E890 Postprocedural hypothyroidism: Secondary | ICD-10-CM | POA: Diagnosis present

## 2020-05-03 DIAGNOSIS — F4310 Post-traumatic stress disorder, unspecified: Secondary | ICD-10-CM | POA: Diagnosis present

## 2020-05-03 DIAGNOSIS — E162 Hypoglycemia, unspecified: Secondary | ICD-10-CM | POA: Insufficient documentation

## 2020-05-03 DIAGNOSIS — Z79899 Other long term (current) drug therapy: Secondary | ICD-10-CM

## 2020-05-03 LAB — CBC WITH DIFFERENTIAL/PLATELET
Abs Immature Granulocytes: 0.04 10*3/uL (ref 0.00–0.07)
Basophils Absolute: 0 10*3/uL (ref 0.0–0.1)
Basophils Relative: 0 %
Eosinophils Absolute: 0.5 10*3/uL (ref 0.0–0.5)
Eosinophils Relative: 5 %
HCT: 33.6 % — ABNORMAL LOW (ref 36.0–46.0)
Hemoglobin: 11 g/dL — ABNORMAL LOW (ref 12.0–15.0)
Immature Granulocytes: 0 %
Lymphocytes Relative: 20 %
Lymphs Abs: 2.1 10*3/uL (ref 0.7–4.0)
MCH: 29.6 pg (ref 26.0–34.0)
MCHC: 32.7 g/dL (ref 30.0–36.0)
MCV: 90.6 fL (ref 80.0–100.0)
Monocytes Absolute: 0.9 10*3/uL (ref 0.1–1.0)
Monocytes Relative: 8 %
Neutro Abs: 7 10*3/uL (ref 1.7–7.7)
Neutrophils Relative %: 67 %
Platelets: 254 10*3/uL (ref 150–400)
RBC: 3.71 MIL/uL — ABNORMAL LOW (ref 3.87–5.11)
RDW: 13.8 % (ref 11.5–15.5)
WBC: 10.6 10*3/uL — ABNORMAL HIGH (ref 4.0–10.5)
nRBC: 0 % (ref 0.0–0.2)

## 2020-05-03 LAB — RESP PANEL BY RT-PCR (FLU A&B, COVID) ARPGX2
Influenza A by PCR: NEGATIVE
Influenza B by PCR: NEGATIVE
SARS Coronavirus 2 by RT PCR: NEGATIVE

## 2020-05-03 LAB — URINALYSIS, ROUTINE W REFLEX MICROSCOPIC
Bilirubin Urine: NEGATIVE
Glucose, UA: NEGATIVE mg/dL
Hgb urine dipstick: NEGATIVE
Ketones, ur: 20 mg/dL — AB
Leukocytes,Ua: NEGATIVE
Nitrite: NEGATIVE
Protein, ur: NEGATIVE mg/dL
Specific Gravity, Urine: 1.014 (ref 1.005–1.030)
pH: 5 (ref 5.0–8.0)

## 2020-05-03 LAB — CBC
HCT: 31.3 % — ABNORMAL LOW (ref 36.0–46.0)
Hemoglobin: 10.3 g/dL — ABNORMAL LOW (ref 12.0–15.0)
MCH: 29.9 pg (ref 26.0–34.0)
MCHC: 32.9 g/dL (ref 30.0–36.0)
MCV: 90.7 fL (ref 80.0–100.0)
Platelets: 227 10*3/uL (ref 150–400)
RBC: 3.45 MIL/uL — ABNORMAL LOW (ref 3.87–5.11)
RDW: 13.9 % (ref 11.5–15.5)
WBC: 9.4 10*3/uL (ref 4.0–10.5)
nRBC: 0 % (ref 0.0–0.2)

## 2020-05-03 LAB — COMPREHENSIVE METABOLIC PANEL
ALT: 17 U/L (ref 0–44)
AST: 27 U/L (ref 15–41)
Albumin: 3.6 g/dL (ref 3.5–5.0)
Alkaline Phosphatase: 37 U/L — ABNORMAL LOW (ref 38–126)
Anion gap: 11 (ref 5–15)
BUN: 15 mg/dL (ref 6–20)
CO2: 30 mmol/L (ref 22–32)
Calcium: 6 mg/dL — CL (ref 8.9–10.3)
Chloride: 100 mmol/L (ref 98–111)
Creatinine, Ser: 0.96 mg/dL (ref 0.44–1.00)
GFR, Estimated: >60 mL/min (ref >60)
Glucose, Bld: 97 mg/dL (ref 70–99)
Potassium: 3.1 mmol/L — ABNORMAL LOW (ref 3.5–5.1)
Sodium: 141 mmol/L (ref 135–145)
Total Bilirubin: 0.6 mg/dL (ref 0.3–1.2)
Total Protein: 7.4 g/dL (ref 6.5–8.1)

## 2020-05-03 LAB — RAPID URINE DRUG SCREEN, HOSP PERFORMED
Amphetamines: NOT DETECTED
Barbiturates: NOT DETECTED
Benzodiazepines: POSITIVE — AB
Cocaine: NOT DETECTED
Opiates: NOT DETECTED
Tetrahydrocannabinol: NOT DETECTED

## 2020-05-03 LAB — CREATININE, SERUM
Creatinine, Ser: 0.65 mg/dL (ref 0.44–1.00)
GFR, Estimated: >60 mL/min (ref >60)

## 2020-05-03 LAB — CK: Total CK: 199 U/L (ref 38–234)

## 2020-05-03 LAB — MAGNESIUM: Magnesium: 1.6 mg/dL — ABNORMAL LOW (ref 1.7–2.4)

## 2020-05-03 LAB — GLUCOSE, CAPILLARY
Glucose-Capillary: 118 mg/dL — ABNORMAL HIGH (ref 70–99)
Glucose-Capillary: 98 mg/dL (ref 70–99)

## 2020-05-03 LAB — CALCIUM: Calcium: 5.6 mg/dL — CL (ref 8.9–10.3)

## 2020-05-03 LAB — PREGNANCY, URINE: Preg Test, Ur: NEGATIVE

## 2020-05-03 LAB — TSH: TSH: 0.537 u[IU]/mL (ref 0.350–4.500)

## 2020-05-03 LAB — HIV ANTIBODY (ROUTINE TESTING W REFLEX): HIV Screen 4th Generation wRfx: NONREACTIVE

## 2020-05-03 MED ORDER — SODIUM CHLORIDE 0.9 % IV BOLUS
500.0000 mL | Freq: Once | INTRAVENOUS | Status: AC
  Administered 2020-05-03: 500 mL via INTRAVENOUS

## 2020-05-03 MED ORDER — METHOCARBAMOL 500 MG PO TABS
750.0000 mg | ORAL_TABLET | Freq: Every day | ORAL | Status: DC
  Administered 2020-05-04 – 2020-05-06 (×3): 750 mg via ORAL
  Filled 2020-05-03 (×3): qty 2

## 2020-05-03 MED ORDER — HYDROCODONE-ACETAMINOPHEN 5-325 MG PO TABS
1.0000 | ORAL_TABLET | ORAL | Status: DC | PRN
Start: 2020-05-03 — End: 2020-05-09
  Administered 2020-05-05: 2 via ORAL
  Filled 2020-05-03: qty 2

## 2020-05-03 MED ORDER — DIVALPROEX SODIUM 250 MG PO DR TAB
500.0000 mg | DELAYED_RELEASE_TABLET | Freq: Every day | ORAL | Status: DC
  Administered 2020-05-04 – 2020-05-09 (×6): 500 mg via ORAL
  Filled 2020-05-03 (×6): qty 2

## 2020-05-03 MED ORDER — ADULT MULTIVITAMIN W/MINERALS CH
1.0000 | ORAL_TABLET | Freq: Every day | ORAL | Status: DC
  Administered 2020-05-04 – 2020-05-09 (×6): 1 via ORAL
  Filled 2020-05-03 (×6): qty 1

## 2020-05-03 MED ORDER — ALBUTEROL SULFATE HFA 108 (90 BASE) MCG/ACT IN AERS: 1.0000 | INHALATION_SPRAY | Freq: Four times a day (QID) | RESPIRATORY_TRACT | Status: DC | PRN

## 2020-05-03 MED ORDER — MAGNESIUM SULFATE 50 % IJ SOLN: 2.0000 g | Freq: Once | INTRAMUSCULAR | Status: DC

## 2020-05-03 MED ORDER — METAXALONE 800 MG PO TABS: 800.0000 mg | ORAL_TABLET | Freq: Every day | ORAL | Status: DC

## 2020-05-03 MED ORDER — ENOXAPARIN SODIUM 40 MG/0.4ML ~~LOC~~ SOLN
40.0000 mg | SUBCUTANEOUS | Status: DC
  Administered 2020-05-03 – 2020-05-07 (×5): 40 mg via SUBCUTANEOUS
  Filled 2020-05-03 (×5): qty 0.4

## 2020-05-03 MED ORDER — CALCIUM GLUCONATE-NACL 2-0.675 GM/100ML-% IV SOLN
2.0000 g | Freq: Once | INTRAVENOUS | Status: AC
  Administered 2020-05-03: 2000 mg via INTRAVENOUS
  Filled 2020-05-03: qty 100

## 2020-05-03 MED ORDER — SERTRALINE HCL 100 MG PO TABS
100.0000 mg | ORAL_TABLET | Freq: Every day | ORAL | Status: DC
  Administered 2020-05-03 – 2020-05-08 (×6): 100 mg via ORAL
  Filled 2020-05-03 (×6): qty 1

## 2020-05-03 MED ORDER — ACETAMINOPHEN 650 MG RE SUPP: 650.0000 mg | Freq: Four times a day (QID) | RECTAL | Status: DC | PRN

## 2020-05-03 MED ORDER — DIAZEPAM 5 MG PO TABS
5.0000 mg | ORAL_TABLET | Freq: Every evening | ORAL | Status: DC | PRN
  Administered 2020-05-06 – 2020-05-07 (×2): 5 mg via ORAL
  Filled 2020-05-03 (×2): qty 1

## 2020-05-03 MED ORDER — POTASSIUM CHLORIDE CRYS ER 20 MEQ PO TBCR
20.0000 meq | EXTENDED_RELEASE_TABLET | Freq: Two times a day (BID) | ORAL | Status: AC
  Administered 2020-05-03 – 2020-05-05 (×4): 20 meq via ORAL
  Filled 2020-05-03 (×4): qty 1

## 2020-05-03 MED ORDER — LEVOTHYROXINE SODIUM 100 MCG PO TABS
100.0000 ug | ORAL_TABLET | Freq: Every day | ORAL | Status: DC
  Administered 2020-05-04 – 2020-05-09 (×6): 100 ug via ORAL
  Filled 2020-05-03 (×6): qty 1

## 2020-05-03 MED ORDER — SODIUM CHLORIDE 0.9 % IV BOLUS
1000.0000 mL | Freq: Once | INTRAVENOUS | Status: AC
  Administered 2020-05-03: 1000 mL via INTRAVENOUS

## 2020-05-03 MED ORDER — INSULIN ASPART 100 UNIT/ML ~~LOC~~ SOLN: 0.0000 [IU] | Freq: Three times a day (TID) | SUBCUTANEOUS | Status: DC

## 2020-05-03 MED ORDER — ESTRADIOL 1 MG PO TABS
1.0000 mg | ORAL_TABLET | Freq: Every day | ORAL | Status: DC
  Administered 2020-05-04 – 2020-05-09 (×6): 1 mg via ORAL
  Filled 2020-05-03 (×6): qty 1

## 2020-05-03 MED ORDER — QUETIAPINE FUMARATE 100 MG PO TABS
100.0000 mg | ORAL_TABLET | Freq: Every day | ORAL | Status: DC
  Administered 2020-05-03 – 2020-05-08 (×6): 100 mg via ORAL
  Filled 2020-05-03 (×6): qty 1

## 2020-05-03 MED ORDER — LORAZEPAM 2 MG/ML IJ SOLN
1.0000 mg | Freq: Once | INTRAMUSCULAR | Status: AC
  Administered 2020-05-03: 1 mg via INTRAVENOUS
  Filled 2020-05-03: qty 1

## 2020-05-03 MED ORDER — ACETAMINOPHEN 325 MG PO TABS
650.0000 mg | ORAL_TABLET | Freq: Four times a day (QID) | ORAL | Status: DC | PRN
  Administered 2020-05-04: 650 mg via ORAL
  Filled 2020-05-03: qty 2

## 2020-05-03 MED ORDER — VALACYCLOVIR HCL 500 MG PO TABS
500.0000 mg | ORAL_TABLET | Freq: Every day | ORAL | Status: DC
  Administered 2020-05-04 – 2020-05-09 (×6): 500 mg via ORAL
  Filled 2020-05-03 (×6): qty 1

## 2020-05-03 MED ORDER — SODIUM CHLORIDE 0.9 % IV SOLN: Freq: Once | INTRAVENOUS | Status: AC

## 2020-05-03 MED ORDER — MAGNESIUM SULFATE 2 GM/50ML IV SOLN
2.0000 g | Freq: Once | INTRAVENOUS | Status: AC
  Administered 2020-05-03: 2 g via INTRAVENOUS
  Filled 2020-05-03: qty 50

## 2020-05-03 MED ORDER — CALCIUM GLUCONATE-NACL 1-0.675 GM/50ML-% IV SOLN
1.0000 g | Freq: Once | INTRAVENOUS | Status: AC
  Administered 2020-05-03: 1000 mg via INTRAVENOUS
  Filled 2020-05-03: qty 50

## 2020-05-03 NOTE — Progress Notes (Signed)
Patient reports that on a previous admission she fell and bumped her face and left hip. She reports not telling anyone. She said that she believes she was in room 1314. Patient also reports soreness to her left hip. No bruising noted. A small red scab is noted by her left eye. No obvious signs that she was injured. Will let provider know and AC. Roderick Pee

## 2020-05-03 NOTE — Progress Notes (Signed)
Patient ID: Frances Davis, female   DOB: 1980/10/23, 40 y.o.   MRN: [id number] Neck is soft, voice normal calcium is 6 and symptomatic On calcium postop Would appreciate TRH help with management and we will follow

## 2020-05-03 NOTE — ED Provider Notes (Signed)
[facility] DEPT Provider Note   CSN: [id number] Arrival date & time: 05/03/20  2229     History Chief Complaint  Patient presents with  . Numbness    Frances Davis is a 40 y.o. female.  The patient is postop day 3 from a total thyroidectomy.  She underwent general anesthesia.  She states that she is having no pain, but she awoke today with diffuse tingling.  She states that her entire face and body are tingling.  She has also noticed cramping in her hands and feet.  Symptoms are not localized to 1 side or the other.  She states that she has been taking all of her medication.  Of note, she is taking tramadol for pain.  The history is provided by the patient.  Neurologic Problem This is a new problem. Episode onset: awoke with symptoms at 5 am. Last known normal yesterday at 8 pm. The problem occurs constantly. The problem has not changed since onset.Pertinent negatives include no chest pain, no abdominal pain, no headaches and no shortness of breath. Nothing aggravates the symptoms. Nothing relieves the symptoms. She has tried nothing for the symptoms. The treatment provided no relief.       Past Medical History:  Diagnosis Date  . Anxiety   . Arthritis   . Asthma   . Cancer ([place])    cervical  . Cervical cancer ([place])   . Complication of anesthesia    Afib post surgey   . Depression   . Diabetes mellitus without complication ([place])    type 2  . GERD (gastroesophageal reflux disease)   . Heart murmur    born with mummur  . History of carpal tunnel surgery of right wrist   . History of carpal tunnel surgery of right wrist   . Hypothyroidism   . Low blood pressure   . Lumbar herniated disc   . Pneumonia   . PTSD (post-traumatic stress disorder)   . Sciatica   . Sleep apnea    no cpap after weight loss   . Thyroid disease    hypothyroidism    Patient Active Problem List   Diagnosis Date Noted  . [name] total thyroidectomy 05/01/2020  .  Asthma, mild intermittent 02/21/2020  . OSA on CPAP 09/26/2018  . Upper respiratory infection 04/15/2017  . Rash of genital area 04/15/2017    Past Surgical History:  Procedure Laterality Date  . ABDOMINAL HYSTERECTOMY    . APPENDECTOMY    . BACK SURGERY     SI joint fusion  . CARPAL TUNNEL RELEASE     bil  . CESAREAN SECTION     x2  . CHOLECYSTECTOMY    . cholesectomy    . colonscopy     polyps removed  . HIP SURGERY Left    sciatica-related, SI joint surgery  . KNEE SURGERY     x2 arthroscopic  bil  . OOPHORECTOMY    . THYROIDECTOMY N/A 05/01/2020   Procedure: TOTAL THYROIDECTOMY;  Surgeon: Coralie Keens, MD;  Location: [facility];  Service: General;  Laterality: N/A;  ROOM 1 STARTING AT 10:30AM FOR 120 MIN     OB History    Gravida  2   Para  2   Term  2   Preterm      AB      Living  2     SAB      IAB      Ectopic  Multiple      Live Births  2           Family History  Problem Relation Age of Onset  . Hypertension Mother   . Thyroid disease Mother   . Heart disease Mother   . Asthma Mother   . Hypertension Father   . Thyroid disease Sister   . Diabetes Brother   . Hypertension Brother   . Kidney disease Brother     Social History   Tobacco Use  . Smoking status: Never Smoker  . Smokeless tobacco: Never Used  Vaping Use  . Vaping Use: Never used  Substance Use Topics  . Alcohol use: No  . Drug use: No    Home Medications Prior to Admission medications   Medication Sig Start Date End Date Taking? Authorizing Provider  albuterol (VENTOLIN HFA) 108 (90 Base) MCG/ACT inhaler Inhale 1-2 puffs into the lungs every 6 (six) hours as needed for wheezing or shortness of breath. 02/21/20   Rigoberto Noel, MD  Calcium Carb-Cholecalciferol (CALCIUM 500 +D) 500-400 MG-UNIT TABS Take 2 tablets by mouth 3 (three) times daily. 05/02/20   Coralie Keens, MD  diazepam (VALIUM) 5 MG tablet Take 5 mg by mouth 2 (two) times daily as needed for  anxiety.    [provider]  divalproex (DEPAKOTE) 500 MG DR tablet Take 500 mg by mouth daily.    [provider]  esomeprazole (NEXIUM) 40 MG capsule Take 40 mg by mouth daily.    [provider]  estradiol (ESTRACE) 1 MG tablet Take 1 mg by mouth daily.    [provider]  famotidine (PEPCID) 40 MG tablet Take 40 mg by mouth daily. 03/26/20   [provider]  hydrOXYzine (ATARAX/VISTARIL) 25 MG tablet Take 25 mg by mouth at bedtime.     [provider]  levothyroxine (SYNTHROID) 100 MCG tablet Take 1 tablet (100 mcg total) by mouth daily. 05/02/20 05/02/21  Coralie Keens, MD  levothyroxine (SYNTHROID) 25 MCG tablet Take 25 mcg by mouth daily before breakfast. 01/03/20   [provider]  metaxalone (SKELAXIN) 800 MG tablet Take 1 tablet (800 mg total) by mouth 3 (three) times daily. 06/13/19   Lacretia Leigh, MD  methocarbamol (ROBAXIN) 750 MG tablet Take 750 mg by mouth 2 (two) times daily.    [provider]  Multiple Vitamin (MULTIVITAMIN WITH MINERALS) TABS tablet Take 1 tablet by mouth daily. Centrum    [provider]  OZEMPIC, 1 MG/DOSE, 4 MG/3ML SOPN Inject 1 mg into the muscle every Wednesday. 03/04/20   [provider]  QUEtiapine (SEROQUEL) 100 MG tablet Take 100 mg by mouth at bedtime.    [provider]  sertraline (ZOLOFT) 100 MG tablet Take 100 mg by mouth at bedtime.     [provider]  traMADol (ULTRAM) 50 MG tablet Take 1-2 tablets (50-100 mg total) by mouth every 6 (six) hours as needed for moderate pain. 05/02/20   Coralie Keens, MD  valACYclovir (VALTREX) 500 MG tablet Take 500 mg by mouth daily.    [provider]    Allergies    Shellfish allergy and Lidocaine  Review of Systems   Review of Systems  Constitutional: Negative for chills and fever.  HENT: Negative for ear pain and sore throat.   Eyes: Negative for pain and visual disturbance.   Respiratory: Negative for cough and shortness of breath.   Cardiovascular: Negative for chest pain and palpitations.  Gastrointestinal: Negative for  abdominal pain and vomiting.  Genitourinary: Negative for dysuria and hematuria.  Musculoskeletal: Negative for arthralgias and back pain.  Skin: Negative for color change and rash.  Neurological: Positive for numbness. Negative for seizures, syncope and headaches.  All other systems reviewed and are negative.   Physical Exam Updated Vital Signs BP 99/71 (BP Location: Left Arm)   Pulse 100   Temp 98.8 F (37.1 C) (Oral)   Resp 17   SpO2 100%  BP normal for patient per review of past medical records.   Physical Exam Vitals and nursing note reviewed.  Constitutional:      General: She is not in acute distress.    Appearance: She is well-developed.  HENT:     Head: Normocephalic and atraumatic.  Eyes:     Conjunctiva/sclera: Conjunctivae normal.  Cardiovascular:     Rate and Rhythm: Regular rhythm. Tachycardia present.     Heart sounds: No murmur heard.   Pulmonary:     Effort: Pulmonary effort is normal. No respiratory distress.     Breath sounds: Normal breath sounds.  Abdominal:     Palpations: Abdomen is soft.     Tenderness: There is no abdominal tenderness.  Musculoskeletal:        General: No swelling or deformity.     Cervical back: Neck supple.     Comments: The patient is resting with her fingers contracted.  No rigidity noted.  She is able to move all her of her fingers.  Skin:    General: Skin is warm and dry.  Neurological:     General: No focal deficit present.     Mental Status: She is alert. Mental status is at baseline.     Cranial Nerves: No cranial nerve deficit.     Sensory: No sensory deficit.     Motor: No weakness.     Coordination: Coordination normal.     Deep Tendon Reflexes:     Reflex Scores:      Bicep reflexes are 2+ on the right side and 2+ on the left side.      Brachioradialis  reflexes are 2+ on the right side and 2+ on the left side.      Patellar reflexes are 1+ on the right side and 1+ on the left side.      Achilles reflexes are 1+ on the right side and 1+ on the left side. Psychiatric:        Mood and Affect: Mood normal.     ED Results / Procedures / Treatments   Labs (all labs ordered are listed, but only abnormal results are displayed) Labs Reviewed  COMPREHENSIVE METABOLIC PANEL - Abnormal; Notable for the following components:      Result Value   Potassium 3.1 (*)    Calcium 6.0 (*)    Alkaline Phosphatase 37 (*)    All other components within normal limits  CBC WITH DIFFERENTIAL/PLATELET - Abnormal; Notable for the following components:   WBC 10.6 (*)    RBC 3.71 (*)    Hemoglobin 11.0 (*)    HCT 33.6 (*)    All other components within normal limits  MAGNESIUM - Abnormal; Notable for the following components:   Magnesium 1.6 (*)    All other components within normal limits  RAPID URINE DRUG SCREEN, HOSP PERFORMED - Abnormal; Notable for the following components:   Benzodiazepines POSITIVE (*)    All other components within normal limits  URINALYSIS, ROUTINE W REFLEX MICROSCOPIC - Abnormal; Notable  for the following components:   Ketones, ur 20 (*)    All other components within normal limits  RESP PANEL BY RT-PCR (FLU A&B, COVID) ARPGX2  TSH  PREGNANCY, URINE  CK    EKG EKG Interpretation  Date/Time:  Saturday May 03 2020 10:24:36 EDT Ventricular Rate:  97 PR Interval:    QRS Duration: 116 QT Interval:  366 QTC Calculation: 465 R Axis:   75 Text Interpretation: Sinus rhythm Nonspecific intraventricular conduction delay QTc normal no acute ischemia Confirmed by Lorre Munroe ([id number]) on 05/03/2020 11:58:21 AM   Radiology No results found.  Procedures Procedures   Medications Ordered in ED Medications  calcium gluconate 1 g/ 50 mL sodium chloride IVPB (1,000 mg Intravenous New Bag/Given 05/03/20 1128)  magnesium sulfate  IVPB 2 g 50 mL (2 g Intravenous New Bag/Given 05/03/20 1130)  calcium gluconate 2 g/ 100 mL sodium chloride IVPB (has no administration in time range)  sodium chloride 0.9 % bolus 500 mL (500 mLs Intravenous Bolus 05/03/20 1023)  LORazepam (ATIVAN) injection 1 mg (1 mg Intravenous Given 05/03/20 1024)  sodium chloride 0.9 % bolus 1,000 mL (1,000 mLs Intravenous Bolus 05/03/20 1113)    ED Course  I have reviewed the triage vital signs and the nursing notes.  Pertinent labs & imaging results that were available during my care of the patient were reviewed by me and considered in my medical decision making (see chart for details).  Clinical Course as of 05/03/20 1158  Sat May 03, 2020  1109 Calcium(!!): 6.0 [AW]  1130 I spoke to the hospitalist, and they recommended consulting general surgery as the patient is 3 days postop. [AW]  1137 I spoke with Dr. Donne Hazel with surgery, and they recommend the patient be admitted by medicine secondary to her electrolyte abnormalities. [AW]    Clinical Course User Index [AW] Arnaldo Natal, MD   MDM Rules/Calculators/A&P                          The patient is 40 years old with a history of hypothyroidism, diabetes.  She presents on postop day 3 after total thyroidectomy complaining of diffuse tingling and muscle spasm.  She was noted to be mildly hypotensive on arrival, and based on her past vital signs, this appears slightly at her baseline.  However, she was also slightly tachycardic.  ED work-up was significant for markedly low calcium and slightly low magnesium.  She is likely experiencing low calcium as a complication of her thyroid surgery. Due to the fact that this value is so low and that she will need quite a bit of correction, I have recommended that she be admitted.  I did initially consider serotonin syndrome as she was prescribed tramadol and takes an SSRI.  I also considered CVA, the but the patient's symptoms were not focal.  Finally, I considered  postop infection and sepsis.   Final Clinical Impression(s) / ED Diagnoses Final diagnoses:  Hypocalcemia  Hypomagnesemia  [name] total thyroidectomy    Rx / DC Orders ED Discharge Orders    None       Arnaldo Natal, MD 05/03/20 1200

## 2020-05-03 NOTE — Progress Notes (Signed)
   05/03/20 1934  Notify: Provider  Provider Name/Title Lovey Newcomer NP  Date Provider Notified 05/03/20  Time Provider Notified 1902  Notification Type Page  Notification Reason Critical result (Ca+ 5.6)  Provider response Other (Comment) (No response yet, night RN Ailene Ravel made aware)

## 2020-05-03 NOTE — ED Triage Notes (Signed)
Pt presents with c/o numbness over her whole body. Pt is 2 days post-op from a thyroidectomy. Pt denies any pain at this time, no obvious neuro deficits noted.

## 2020-05-03 NOTE — Progress Notes (Signed)
Provider ordered 2 g of calcium gluconate IV at 1948, which was given. Patient's calcium level will be checked again close to midnight and 5 am to monitor.Frances Davis

## 2020-05-03 NOTE — H&P (Addendum)
History and Physical    Frances Davis JME:[id number] DOB: November 09, 1980 DOA: 05/03/2020  PCP: Frances Seashore, MD  Patient coming from: Home  Chief Complaint: "feeling numb from head to toe"  HPI: Frances Davis is a 40 y.o. female with medical history significant of anxiety, acquired hypothyroidism s/p thyroidectomy 2 days ago, GERD. Presenting with numbness. She reports that it started yesterday. She felt muscle tightness and cramping all over which make it difficult to move. She denies any pain; but does reports some tingling in several areas around her body. Her symptoms are still present. She became concerned and decided to come to the ED.  Of note, she had a thyroidectomy on 05/01/20. She was sent home with levothyroxine and calcium. She reports compliance with these medications. She denies any other aggravating or alleviating factors.   ED Course: She was found to have a calcium of 6.0. Her K+ was 3.1 and her Mg2+ was 1.6. EDP spoke with general surgery. They recommended medical admission due to concern for electrolyte disturbance. TRH was called for admission.   Review of Systems:  Denies CP, palpitations, dyspnea, N/V/D, fevers, syncopal episodes, hallucinations, abdominal pain. Review of systems is otherwise negative for all not mentioned in HPI.   PMHx Past Medical History:  Diagnosis Date  . Anxiety   . Arthritis   . Asthma   . Cancer ([place])    cervical  . Cervical cancer ([place])   . Complication of anesthesia    Afib post surgey   . Depression   . Diabetes mellitus without complication ([place])    type 2  . GERD (gastroesophageal reflux disease)   . Heart murmur    born with mummur  . History of carpal tunnel surgery of right wrist   . History of carpal tunnel surgery of right wrist   . Hypothyroidism   . Low blood pressure   . Lumbar herniated disc   . Pneumonia   . PTSD (post-traumatic stress disorder)   . Sciatica   . Sleep apnea    no cpap after weight loss    . Thyroid disease    hypothyroidism    PSHx Past Surgical History:  Procedure Laterality Date  . ABDOMINAL HYSTERECTOMY    . APPENDECTOMY    . BACK SURGERY     SI joint fusion  . CARPAL TUNNEL RELEASE     bil  . CESAREAN SECTION     x2  . CHOLECYSTECTOMY    . cholesectomy    . colonscopy     polyps removed  . HIP SURGERY Left    sciatica-related, SI joint surgery  . KNEE SURGERY     x2 arthroscopic  bil  . OOPHORECTOMY    . THYROIDECTOMY N/A 05/01/2020   Procedure: TOTAL THYROIDECTOMY;  Surgeon: Frances Keens, MD;  Location: [facility];  Service: General;  Laterality: N/A;  ROOM 1 STARTING AT 10:30AM FOR 120 MIN    SocHx  reports that she has never smoked. She has never used smokeless tobacco. She reports that she does not drink alcohol and does not use drugs.  Allergies  Allergen Reactions  . Shellfish Allergy Anaphylaxis    Reaction to shrimp only  . Lidocaine Hives    FamHx Family History  Problem Relation Age of Onset  . Hypertension Mother   . Thyroid disease Mother   . Heart disease Mother   . Asthma Mother   . Hypertension Father   . Thyroid disease Sister   . Diabetes Brother   .  Hypertension Brother   . Kidney disease Brother     Prior to Admission medications   Medication Sig Start Date End Date Taking? Authorizing Provider  albuterol (VENTOLIN HFA) 108 (90 Base) MCG/ACT inhaler Inhale 1-2 puffs into the lungs every 6 (six) hours as needed for wheezing or shortness of breath. 02/21/20   Rigoberto Noel, MD  Calcium Carb-Cholecalciferol (CALCIUM 500 +D) 500-400 MG-UNIT TABS Take 2 tablets by mouth 3 (three) times daily. 05/02/20   Frances Keens, MD  diazepam (VALIUM) 5 MG tablet Take 5 mg by mouth 2 (two) times daily as needed for anxiety.    [provider]  divalproex (DEPAKOTE) 500 MG DR tablet Take 500 mg by mouth daily.    [provider]  esomeprazole (NEXIUM) 40 MG capsule Take 40 mg by mouth daily.    [provider]  estradiol (ESTRACE) 1 MG tablet Take 1 mg by mouth daily.    [provider]  famotidine (PEPCID) 40 MG tablet Take 40 mg by mouth daily. 03/26/20   [provider]  hydrOXYzine (ATARAX/VISTARIL) 25 MG tablet Take 25 mg by mouth at bedtime.     [provider]  levothyroxine (SYNTHROID) 100 MCG tablet Take 1 tablet (100 mcg total) by mouth daily. 05/02/20 05/02/21  Frances Keens, MD  levothyroxine (SYNTHROID) 25 MCG tablet Take 25 mcg by mouth daily before breakfast. 01/03/20   [provider]  metaxalone (SKELAXIN) 800 MG tablet Take 1 tablet (800 mg total) by mouth 3 (three) times daily. 06/13/19   Lacretia Leigh, MD  methocarbamol (ROBAXIN) 750 MG tablet Take 750 mg by mouth 2 (two) times daily.    [provider]  Multiple Vitamin (MULTIVITAMIN WITH MINERALS) TABS tablet Take 1 tablet by mouth daily. Centrum    [provider]  OZEMPIC, 1 MG/DOSE, 4 MG/3ML SOPN Inject 1 mg into the muscle every Wednesday. 03/04/20   [provider]  QUEtiapine (SEROQUEL) 100 MG tablet Take 100 mg by mouth at bedtime.    [provider]  sertraline (ZOLOFT) 100 MG tablet Take 100 mg by mouth at bedtime.     [provider]  traMADol (ULTRAM) 50 MG tablet Take 1-2 tablets (50-100 mg total) by mouth every 6 (six) hours as needed for moderate pain. 05/02/20   Frances Keens, MD  valACYclovir (VALTREX) 500 MG tablet Take 500 mg by mouth daily.    [provider]    Physical Exam: Vitals:   05/03/20 1030 05/03/20 1100 05/03/20 1105 05/03/20 1130  BP: 94/65 (!) 89/61 (!) 87/69 101/68  Pulse: (!) 102 98 (!) 103 (!) 107  Resp: 11 13 13 20   Temp:      TempSrc:      SpO2: 100% 100% 100% 96%    General: 40 y.o. female resting in bed in NAD Eyes: PERRL, normal sclera ENMT: Nares patent w/o discharge, orophaynx clear, dentition normal, ears w/o discharge/lesions/ulcers Neck: Supple, trachea midline, surgical site noted,  no drainage/erythema noted Cardiovascular: RRR, +S1, S2, no m/g/r, equal pulses throughout Respiratory: CTABL, no w/r/r, normal WOB GI: BS+, NDNT, no masses noted, no organomegaly noted MSK: No e/c/c Skin: No rashes, bruises, ulcerations noted Neuro: A&O x 3, no focal deficits, generalized weakness noted Psyc: Appropriate interaction and affect, calm/cooperative  Labs on Admission: I have personally reviewed following labs and imaging studies  CBC: Recent Labs  Lab 05/03/20 1014  WBC 10.6*  NEUTROABS 7.0  HGB 11.0*  HCT 33.6*  MCV 90.6  PLT 751   Basic Metabolic Panel: Recent Labs  Lab 05/01/20 1810 05/02/20 0446 05/03/20 1014  NA 138 138 141  K 3.8 4.4 3.1*  CL 102 105 100  CO2 27 26 30   GLUCOSE 127* 89 97  BUN 14 12 15   CREATININE 0.90 0.80 0.96  CALCIUM 8.2* 7.1* 6.0*  MG  --   --  1.6*   GFR: Estimated Creatinine Clearance: 67.5 mL/min (by C-G formula based on SCr of 0.96 mg/dL). Liver Function Tests: Recent Labs  Lab 05/03/20 1014  AST 27  ALT 17  ALKPHOS 37*  BILITOT 0.6  PROT 7.4  ALBUMIN 3.6   No results for input(s): LIPASE, AMYLASE in the last 168 hours. No results for input(s): AMMONIA in the last 168 hours. Coagulation Profile: No results for input(s): INR, PROTIME in the last 168 hours. Cardiac Enzymes: Recent Labs  Lab 05/03/20 1014  CKTOTAL 199   BNP (last 3 results) No results for input(s): PROBNP in the last 8760 hours. HbA1C: No results for input(s): HGBA1C in the last 72 hours. CBG: Recent Labs  Lab 05/01/20 0820 05/01/20 1310  GLUCAP 74 121*   Lipid Profile: No results for input(s): CHOL, HDL, LDLCALC, TRIG, CHOLHDL, LDLDIRECT in the last 72 hours. Thyroid Function Tests: Recent Labs    05/03/20 1014  TSH 0.537   Anemia Panel: No results for input(s): VITAMINB12, FOLATE, FERRITIN, TIBC, IRON, RETICCTPCT in the last 72 hours. Urine analysis:    Component Value Date/Time   COLORURINE YELLOW 05/03/2020 1014    APPEARANCEUR CLEAR 05/03/2020 1014   LABSPEC 1.014 05/03/2020 1014   PHURINE 5.0 05/03/2020 1014   GLUCOSEU NEGATIVE 05/03/2020 1014   [place] 05/03/2020 1014   [place] 05/03/2020 1014   KETONESUR 20 (A) 05/03/2020 1014   PROTEINUR NEGATIVE 05/03/2020 1014   NITRITE NEGATIVE 05/03/2020 [place] 05/03/2020 1014    Radiological Exams on Admission: No results found.  EKG: Independently reviewed. NSR, no st elevations  Assessment/Plan Hypocalcemia     - place in obs, tele     - secondary to complication from thyroidectomy 2 days ago     - check ionized Ca2+ now, repeat q6h     - continue Ca2+ IV until normalized; with plans to switch to PO calcium and vit D then     - surgery to see  Hypomagnesemia Hypokalemia     - both mild; replace, follow  Hypothyroidism     - continue synthroid  Anxiety     - continue home regimen  DM2     - holding home ozempic     - SSI, DM diet, glucose checks, A1c  DVT prophylaxis: lovenox  Code Status: FULL  Family Communication: w/ brother at bedside  Consults called: general surgery   Status is: Observation  The patient remains OBS appropriate and will d/c before 2 midnights.  Dispo: The patient is from: Home              Anticipated d/c is to: Home              Patient currently is not medically stable to d/c.   Difficult to place patient No  Jonnie Finner DO [facility]  If 7PM-7AM, please contact night-coverage [email or web address]  05/03/2020, 11:49 AM

## 2020-05-04 DIAGNOSIS — Z79818 Long term (current) use of other agents affecting estrogen receptors and estrogen levels: Secondary | ICD-10-CM | POA: Diagnosis not present

## 2020-05-04 DIAGNOSIS — Z91013 Allergy to seafood: Secondary | ICD-10-CM | POA: Diagnosis not present

## 2020-05-04 DIAGNOSIS — Z884 Allergy status to anesthetic agent status: Secondary | ICD-10-CM | POA: Diagnosis not present

## 2020-05-04 DIAGNOSIS — E119 Type 2 diabetes mellitus without complications: Secondary | ICD-10-CM | POA: Diagnosis present

## 2020-05-04 DIAGNOSIS — Z8541 Personal history of malignant neoplasm of cervix uteri: Secondary | ICD-10-CM | POA: Diagnosis not present

## 2020-05-04 DIAGNOSIS — F32A Depression, unspecified: Secondary | ICD-10-CM | POA: Diagnosis present

## 2020-05-04 DIAGNOSIS — K219 Gastro-esophageal reflux disease without esophagitis: Secondary | ICD-10-CM | POA: Diagnosis present

## 2020-05-04 DIAGNOSIS — Z79899 Other long term (current) drug therapy: Secondary | ICD-10-CM | POA: Diagnosis not present

## 2020-05-04 DIAGNOSIS — Z20822 Contact with and (suspected) exposure to covid-19: Secondary | ICD-10-CM | POA: Diagnosis present

## 2020-05-04 DIAGNOSIS — Z79890 Hormone replacement therapy (postmenopausal): Secondary | ICD-10-CM | POA: Diagnosis not present

## 2020-05-04 DIAGNOSIS — G4733 Obstructive sleep apnea (adult) (pediatric): Secondary | ICD-10-CM | POA: Diagnosis present

## 2020-05-04 DIAGNOSIS — F4310 Post-traumatic stress disorder, unspecified: Secondary | ICD-10-CM | POA: Diagnosis present

## 2020-05-04 DIAGNOSIS — E876 Hypokalemia: Secondary | ICD-10-CM | POA: Diagnosis present

## 2020-05-04 DIAGNOSIS — E890 Postprocedural hypothyroidism: Secondary | ICD-10-CM | POA: Diagnosis present

## 2020-05-04 DIAGNOSIS — D62 Acute posthemorrhagic anemia: Secondary | ICD-10-CM | POA: Diagnosis present

## 2020-05-04 DIAGNOSIS — Z791 Long term (current) use of non-steroidal anti-inflammatories (NSAID): Secondary | ICD-10-CM | POA: Diagnosis not present

## 2020-05-04 DIAGNOSIS — Z833 Family history of diabetes mellitus: Secondary | ICD-10-CM | POA: Diagnosis not present

## 2020-05-04 DIAGNOSIS — I959 Hypotension, unspecified: Secondary | ICD-10-CM | POA: Diagnosis present

## 2020-05-04 DIAGNOSIS — F419 Anxiety disorder, unspecified: Secondary | ICD-10-CM | POA: Diagnosis present

## 2020-05-04 LAB — COMPREHENSIVE METABOLIC PANEL
ALT: 15 U/L (ref 0–44)
AST: 22 U/L (ref 15–41)
Albumin: 2.7 g/dL — ABNORMAL LOW (ref 3.5–5.0)
Alkaline Phosphatase: 30 U/L — ABNORMAL LOW (ref 38–126)
Anion gap: 10 (ref 5–15)
BUN: 12 mg/dL (ref 6–20)
CO2: 28 mmol/L (ref 22–32)
Calcium: 5.9 mg/dL — CL (ref 8.9–10.3)
Chloride: 104 mmol/L (ref 98–111)
Creatinine, Ser: 0.68 mg/dL (ref 0.44–1.00)
GFR, Estimated: >60 mL/min (ref >60)
Glucose, Bld: 93 mg/dL (ref 70–99)
Potassium: 3.6 mmol/L (ref 3.5–5.1)
Sodium: 142 mmol/L (ref 135–145)
Total Bilirubin: 0.6 mg/dL (ref 0.3–1.2)
Total Protein: 5.5 g/dL — ABNORMAL LOW (ref 6.5–8.1)

## 2020-05-04 LAB — GLUCOSE, CAPILLARY
Glucose-Capillary: 95 mg/dL (ref 70–99)
Glucose-Capillary: 77 mg/dL (ref 70–99)
Glucose-Capillary: 91 mg/dL (ref 70–99)
Glucose-Capillary: 92 mg/dL (ref 70–99)
Glucose-Capillary: 84 mg/dL (ref 70–99)

## 2020-05-04 LAB — CBC
HCT: 28.6 % — ABNORMAL LOW (ref 36.0–46.0)
Hemoglobin: 9.4 g/dL — ABNORMAL LOW (ref 12.0–15.0)
MCH: 30 pg (ref 26.0–34.0)
MCHC: 32.9 g/dL (ref 30.0–36.0)
MCV: 91.4 fL (ref 80.0–100.0)
Platelets: 223 10*3/uL (ref 150–400)
RBC: 3.13 MIL/uL — ABNORMAL LOW (ref 3.87–5.11)
RDW: 13.8 % (ref 11.5–15.5)
WBC: 9.2 10*3/uL (ref 4.0–10.5)
nRBC: 0 % (ref 0.0–0.2)

## 2020-05-04 LAB — CALCIUM
Calcium: 5.9 mg/dL — CL (ref 8.9–10.3)
Calcium: 6.3 mg/dL — CL (ref 8.9–10.3)
Calcium: 6 mg/dL — CL (ref 8.9–10.3)

## 2020-05-04 LAB — CALCIUM, IONIZED: Calcium, Ionized, Serum: 3.4 mg/dL — ABNORMAL LOW (ref 4.5–5.6)

## 2020-05-04 LAB — MAGNESIUM: Magnesium: 1.3 mg/dL — ABNORMAL LOW (ref 1.7–2.4)

## 2020-05-04 MED ORDER — CALCIUM GLUCONATE-NACL 2-0.675 GM/100ML-% IV SOLN
2.0000 g | Freq: Once | INTRAVENOUS | Status: AC
  Administered 2020-05-04: 2000 mg via INTRAVENOUS
  Filled 2020-05-04: qty 100

## 2020-05-04 MED ORDER — HYDROXYZINE HCL 25 MG PO TABS
25.0000 mg | ORAL_TABLET | Freq: Three times a day (TID) | ORAL | Status: DC | PRN
  Administered 2020-05-04 – 2020-05-08 (×5): 25 mg via ORAL
  Filled 2020-05-04 (×5): qty 1

## 2020-05-04 MED ORDER — POLYETHYLENE GLYCOL 3350 17 G PO PACK
17.0000 g | PACK | Freq: Every day | ORAL | Status: DC
  Administered 2020-05-04 – 2020-05-07 (×4): 17 g via ORAL
  Filled 2020-05-04 (×6): qty 1

## 2020-05-04 MED ORDER — MAGNESIUM SULFATE 4 GM/100ML IV SOLN
4.0000 g | Freq: Once | INTRAVENOUS | Status: AC
  Administered 2020-05-04: 4 g via INTRAVENOUS
  Filled 2020-05-04: qty 100

## 2020-05-04 MED ORDER — CALCIUM CARB-CHOLECALCIFEROL 500-400 MG-UNIT PO TABS: 2.0000 | ORAL_TABLET | Freq: Three times a day (TID) | ORAL | Status: DC

## 2020-05-04 MED ORDER — SENNOSIDES-DOCUSATE SODIUM 8.6-50 MG PO TABS
1.0000 | ORAL_TABLET | Freq: Two times a day (BID) | ORAL | Status: DC
  Administered 2020-05-04 – 2020-05-09 (×11): 1 via ORAL
  Filled 2020-05-04 (×11): qty 1

## 2020-05-04 MED ORDER — METHOCARBAMOL 1000 MG/10ML IJ SOLN
500.0000 mg | Freq: Once | INTRAVENOUS | Status: AC
  Administered 2020-05-04: 500 mg via INTRAVENOUS
  Filled 2020-05-04: qty 5
  Filled 2020-05-04: qty 500

## 2020-05-04 MED ORDER — SODIUM CHLORIDE 0.9 % IV SOLN: Freq: Once | INTRAVENOUS | Status: AC

## 2020-05-04 MED ORDER — CALCIUM CARBONATE-VITAMIN D 500-200 MG-UNIT PO TABS
2.0000 | ORAL_TABLET | Freq: Three times a day (TID) | ORAL | Status: DC
  Administered 2020-05-04 – 2020-05-07 (×10): 2 via ORAL
  Filled 2020-05-04 (×10): qty 2

## 2020-05-04 MED ORDER — SODIUM CHLORIDE 0.9 % IV SOLN
INTRAVENOUS | Status: AC
  Administered 2020-05-04: 1000 mL via INTRAVENOUS

## 2020-05-04 NOTE — Progress Notes (Signed)
Paged on call just now a second time to see if we could recheck calcium more often overnight. Patient still has 6.0 calcium level and is symptomatic. Frances Davis

## 2020-05-04 NOTE — Progress Notes (Signed)
   Subjective/Chief Complaint: Still with numbness, low calcium   Objective: Vital signs in last 24 hours: Temp:  [98 F (36.7 C)-99.5 F (37.5 C)] 99.2 F (37.3 C) (03/20 0544) Pulse Rate:  [79-109] 95 (03/20 0544) Resp:  [11-20] 14 (03/20 0544) BP: (86-107)/(50-84) 100/55 (03/20 0544) SpO2:  [94 %-100 %] 100 % (03/20 0544) Weight:  [64.8 kg] 64.8 kg (03/19 1537) Last BM Date: 05/03/20  Intake/Output from previous day: 03/19 0701 - 03/20 0700 In: 975.9 [P.O.:360; I.V.:420; IV Piggyback:195.9] Out: -  Intake/Output this shift: No intake/output data recorded.  Neck: soft incision clean  Lab Results:  Recent Labs    05/03/20 1519 05/04/20 0021  WBC 9.4 9.2  HGB 10.3* 9.4*  HCT 31.3* 28.6*  PLT 227 223   BMET Recent Labs    05/03/20 1014 05/03/20 1519 05/03/20 1808 05/04/20 0021  NA 141  --   --  142  K 3.1*  --   --  3.6  CL 100  --   --  104  CO2 30  --   --  28  GLUCOSE 97  --   --  93  BUN 15  --   --  12  CREATININE 0.96 0.65  --  0.68  CALCIUM 6.0*  --  5.6* 5.9*  5.9*   PT/INR No results for input(s): LABPROT, INR in the last 72 hours. ABG No results for input(s): PHART, HCO3 in the last 72 hours.  Invalid input(s): PCO2, PO2  Studies/Results: No results found.  Anti-infectives: Anti-infectives (From admission, onward)   Start     Dose/Rate Route Frequency Ordered Stop   05/04/20 1000  valACYclovir (VALTREX) tablet 500 mg        500 mg Oral Daily 05/03/20 1455        Assessment/Plan: POD 3 total thyroidectomy -regular diet -appreciate TRH assistance- calcium still low with symptoms -will continue to follow her while here  Rolm Bookbinder 05/04/2020

## 2020-05-04 NOTE — Progress Notes (Signed)
PROGRESS NOTE  Tagen Brethauer AYT:[id number] DOB: [birth year] DOA: 05/03/2020 PCP: Merrilee Seashore, MD  HPI/Recap of past 24 hours: HPI from Dr Mort Sawyers is a 40 y.o. female with medical history significant of anxiety, acquired hypothyroidism s/p thyroidectomy 2 days ago, GERD. Presenting with numbness X 1 day. She felt muscle tightness and cramping all over which make it difficult to move. She denies any pain; but does reports some tingling in several areas around her body. She became concerned and decided to come to the ED. Of note, she had a thyroidectomy on 05/01/20. She was sent home with levothyroxine and calcium. She reports compliance with these medications. In the ED, pt was found to have a calcium of 6.0. Her K+ was 3.1 and her Mg2+ was 1.6. EDP spoke with general surgery. They recommended medical admission due to concern for electrolyte disturbance. TRH was called for admission.     Today, pt still reported generalized numbness, with generalized weakness. Denies any chest pain, abdominal pain, SOB, N/V/D, fever/chills  Assessment/Plan: Principal Problem:   Hypocalcemia   Hypocalcemia Symptomatic Likely 2/2 from thyroidectomy on 05/01/2020 Continue aggressive calcium replacements Frequent calcium checks  Hypomagnesemia/hypokalemia Replace as needed  Acute blood loss anemia Likely 2/2 recent surgery Daily CBC  Hypothyroidism Continue Synthroid  Diabetes mellitus type 2, controlled Last A1c--> 5 on 04/23/2020 Continue SSI, Accu-Cheks, hypoglycemic protocol Hold home Ozempic  Mild hypotension Could be related to ?  Poor oral intake Noted some dizziness Continue IV gentle hydration for now  Depression/anxiety Continue home meds     Estimated body mass index is 27.9 kg/m as calculated from the following:   Height as of this encounter: 5' (1.524 m).   Weight as of this encounter: 64.8 kg.     Code Status: Full  Family Communication: Discussed  with patient extensively  Disposition Plan: Status is: Observation  The patient will require care spanning > 2 midnights and should be moved to inpatient because: Inpatient level of care appropriate due to severity of illness  Dispo: The patient is from: Home              Anticipated d/c is to: Home              Patient currently is not medically stable to d/c.   Difficult to place patient No   Consultants:  General surgery  Procedures:  None  Antimicrobials:  None  DVT prophylaxis: Lovenox   Objective: Vitals:   05/03/20 2255 05/04/20 0227 05/04/20 0300 05/04/20 0544  BP:  (!) 87/56 (!) 90/50 (!) 100/55  Pulse: 99 93  95  Resp: 18 14  14   Temp: 98.9 F (37.2 C) 99.5 F (37.5 C)  99.2 F (37.3 C)  TempSrc: Oral Oral  Oral  SpO2: 99% 94%  100%  Weight:      Height:        Intake/Output Summary (Last 24 hours) at 05/04/2020 1249 Last data filed at 05/04/2020 1030 Gross per 24 hour  Intake 1215.93 ml  Output --  Net 1215.93 ml   Filed Weights   05/03/20 1537  Weight: 64.8 kg    Exam:  General: NAD, noted postop scar around neck, C/D/I  Cardiovascular: S1, S2 present  Respiratory: CTAB  Abdomen: Soft, nontender, nondistended, bowel sounds present  Musculoskeletal: No bilateral pedal edema noted  Skin: Normal  Psychiatry: Normal mood    Data Reviewed: CBC: Recent Labs  Lab 05/03/20 1014 05/03/20 1519 05/04/20 0021  WBC 10.6*  9.4 9.2  NEUTROABS 7.0  --   --   HGB 11.0* 10.3* 9.4*  HCT 33.6* 31.3* 28.6*  MCV 90.6 90.7 91.4  PLT 254 227 010   Basic Metabolic Panel: Recent Labs  Lab 05/01/20 1810 05/02/20 0446 05/03/20 1014 05/03/20 1519 05/03/20 1808 05/04/20 0021  NA 138 138 141  --   --  142  K 3.8 4.4 3.1*  --   --  3.6  CL 102 105 100  --   --  104  CO2 27 26 30   --   --  28  GLUCOSE 127* 89 97  --   --  93  BUN 14 12 15   --   --  12  CREATININE 0.90 0.80 0.96 0.65  --  0.68  CALCIUM 8.2* 7.1* 6.0*  --  5.6* 5.9*  5.9*   MG  --   --  1.6*  --   --   --    GFR: Estimated Creatinine Clearance: 80.9 mL/min (by C-G formula based on SCr of 0.68 mg/dL). Liver Function Tests: Recent Labs  Lab 05/03/20 1014 05/04/20 0021  AST 27 22  ALT 17 15  ALKPHOS 37* 30*  BILITOT 0.6 0.6  PROT 7.4 5.5*  ALBUMIN 3.6 2.7*   No results for input(s): LIPASE, AMYLASE in the last 168 hours. No results for input(s): AMMONIA in the last 168 hours. Coagulation Profile: No results for input(s): INR, PROTIME in the last 168 hours. Cardiac Enzymes: Recent Labs  Lab 05/03/20 1014  CKTOTAL 199   BNP (last 3 results) No results for input(s): PROBNP in the last 8760 hours. HbA1C: No results for input(s): HGBA1C in the last 72 hours. CBG: Recent Labs  Lab 05/03/20 1736 05/03/20 2142 05/04/20 0000 05/04/20 0738 05/04/20 1222  GLUCAP 118* 98 95 77 91   Lipid Profile: No results for input(s): CHOL, HDL, LDLCALC, TRIG, CHOLHDL, LDLDIRECT in the last 72 hours. Thyroid Function Tests: Recent Labs    05/03/20 1014  TSH 0.537   Anemia Panel: No results for input(s): VITAMINB12, FOLATE, FERRITIN, TIBC, IRON, RETICCTPCT in the last 72 hours. Urine analysis:    Component Value Date/Time   COLORURINE YELLOW 05/03/2020 1014   APPEARANCEUR CLEAR 05/03/2020 1014   LABSPEC 1.014 05/03/2020 1014   PHURINE 5.0 05/03/2020 1014   GLUCOSEU NEGATIVE 05/03/2020 1014   [place] 05/03/2020 1014   [place] 05/03/2020 1014   KETONESUR 20 (A) 05/03/2020 1014   PROTEINUR NEGATIVE 05/03/2020 1014   NITRITE NEGATIVE 05/03/2020 1014   LEUKOCYTESUR NEGATIVE 05/03/2020 1014   Sepsis Labs: @LABRCNTIP (procalcitonin:4,lacticidven:4)  ) Recent Results (from the past 240 hour(s))  SARS CORONAVIRUS 2 (TAT 6-24 HRS) Nasopharyngeal Nasopharyngeal Swab     Status: None   Collection Time: 04/28/20 10:45 AM   Specimen: Nasopharyngeal Swab  Result Value Ref Range Status   SARS Coronavirus 2 NEGATIVE NEGATIVE Final     Comment: (NOTE) SARS-CoV-2 target nucleic acids are NOT DETECTED.  The SARS-CoV-2 RNA is generally detectable in upper and lower respiratory specimens during the acute phase of infection. Negative results do not preclude SARS-CoV-2 infection, do not rule out co-infections with other pathogens, and should not be used as the sole basis for treatment or other patient management decisions. Negative results must be combined with clinical observations, patient history, and epidemiological information. The expected result is Negative.  Fact Sheet for Patients: SugarRoll.be  Fact Sheet for Healthcare Providers: https://www.woods-mathews.com/  This test is not yet approved or cleared by the  [place] Architectural [employer] and  has been authorized for detection and/or diagnosis of SARS-CoV-2 by FDA under an Print production planner (EUA). This EUA will remain  in effect (meaning this test can be used) for the duration of the COVID-19 declaration under Se ction 564(b)(1) of the Act, 21 U.S.C. section 360bbb-3(b)(1), unless the authorization is terminated or revoked sooner.  Performed at [facility], [place] [address], [place], [place] [address]   Resp Panel by RT-PCR (Flu A&B, Covid) Nasopharyngeal Swab     Status: None   Collection Time: 05/03/20 11:49 AM   Specimen: Nasopharyngeal Swab; Nasopharyngeal(NP) swabs in vial transport medium  Result Value Ref Range Status   SARS Coronavirus 2 by RT PCR NEGATIVE NEGATIVE Final    Comment: (NOTE) SARS-CoV-2 target nucleic acids are NOT DETECTED.  The SARS-CoV-2 RNA is generally detectable in upper respiratory specimens during the acute phase of infection. The lowest concentration of SARS-CoV-2 viral copies this assay can detect is 138 copies/mL. A negative result does not preclude SARS-Cov-2 infection and should not be used as the sole basis for treatment or other patient management decisions. A negative  result may occur with  improper specimen collection/handling, submission of specimen other than nasopharyngeal swab, presence of viral mutation(s) within the areas targeted by this assay, and inadequate number of viral copies(<138 copies/mL). A negative result must be combined with clinical observations, patient history, and epidemiological information. The expected result is Negative.  Fact Sheet for Patients:  EntrepreneurPulse.com.au  Fact Sheet for Healthcare Providers:  IncredibleEmployment.be  This test is no t yet approved or cleared by the [place] FDA and  has been authorized for detection and/or diagnosis of SARS-CoV-2 by FDA under an Emergency Use Authorization (EUA). This EUA will remain  in effect (meaning this test can be used) for the duration of the COVID-19 declaration under Section 564(b)(1) of the Act, 21 U.S.C.section 360bbb-3(b)(1), unless the authorization is terminated  or revoked sooner.       Influenza A by PCR NEGATIVE NEGATIVE Final   Influenza B by PCR NEGATIVE NEGATIVE Final    Comment: (NOTE) The Xpert Xpress SARS-CoV-2/FLU/RSV plus assay is intended as an aid in the diagnosis of influenza from Nasopharyngeal swab specimens and should not be used as a sole basis for treatment. Nasal washings and aspirates are unacceptable for Xpert Xpress SARS-CoV-2/FLU/RSV testing.  Fact Sheet for Patients: EntrepreneurPulse.com.au  Fact Sheet for Healthcare Providers: IncredibleEmployment.be  This test is not yet approved or cleared by the [place] FDA and has been authorized for detection and/or diagnosis of SARS-CoV-2 by FDA under an Emergency Use Authorization (EUA). This EUA will remain in effect (meaning this test can be used) for the duration of the COVID-19 declaration under Section 564(b)(1) of the Act, 21 U.S.C. section 360bbb-3(b)(1), unless the authorization is  terminated or revoked.  Performed at [facility], [place] [address] [place], [place] [address]       Studies: No results found.  Scheduled Meds: . calcium-vitamin D  2 tablet Oral TID  . divalproex  500 mg Oral Daily  . enoxaparin (LOVENOX) injection  40 mg Subcutaneous Q24H  . estradiol  1 mg Oral Daily  . insulin aspart  0-6 Units Subcutaneous TID WC  . levothyroxine  100 mcg Oral Daily  . methocarbamol  750 mg Oral Daily  . multivitamin with minerals  1 tablet Oral Daily  . polyethylene glycol  17 g Oral Daily  . potassium chloride  20 mEq  Oral BID  . QUEtiapine  100 mg Oral QHS  . senna-docusate  1 tablet Oral BID  . sertraline  100 mg Oral QHS  . valACYclovir  500 mg Oral Daily    Continuous Infusions: . sodium chloride 1,000 mL (05/04/20 1130)     LOS: 0 days     Alma Friendly, MD [facility]  If 7PM-7AM, please contact night-coverage [email or web address] 05/04/2020, 12:49 PM

## 2020-05-04 NOTE — Progress Notes (Signed)
Date and time results received: 05/04/20 at 0135  Test: calcium  Critical Value: 5.9  Name of Provider Notified: [place]  Orders Received? Or Actions Taken?: orders pending.

## 2020-05-04 NOTE — Progress Notes (Signed)
Date and time results received: 05/04/20  2200  Test: Calcium Critical Value: 6.0  Name of Provider Notified: [facility]  Orders Received? Or Actions Taken?:Orders pending

## 2020-05-05 LAB — VITAMIN D 25 HYDROXY (VIT D DEFICIENCY, FRACTURES): Vit D, 25-Hydroxy: 25.85 ng/mL — ABNORMAL LOW (ref 30–100)

## 2020-05-05 LAB — BASIC METABOLIC PANEL WITH GFR
Anion gap: 14 (ref 5–15)
BUN: 13 mg/dL (ref 6–20)
CO2: 27 mmol/L (ref 22–32)
Calcium: 6.6 mg/dL — ABNORMAL LOW (ref 8.9–10.3)
Chloride: 100 mmol/L (ref 98–111)
Creatinine, Ser: 0.76 mg/dL (ref 0.44–1.00)
GFR, Estimated: >60 mL/min
Glucose, Bld: 92 mg/dL (ref 70–99)
Potassium: 5 mmol/L (ref 3.5–5.1)
Sodium: 141 mmol/L (ref 135–145)

## 2020-05-05 LAB — BASIC METABOLIC PANEL
Anion gap: 10 (ref 5–15)
BUN: 11 mg/dL (ref 6–20)
CO2: 26 mmol/L (ref 22–32)
Calcium: 5.8 mg/dL — CL (ref 8.9–10.3)
Chloride: 103 mmol/L (ref 98–111)
Creatinine, Ser: 0.72 mg/dL (ref 0.44–1.00)
GFR, Estimated: >60 mL/min (ref >60)
Glucose, Bld: 88 mg/dL (ref 70–99)
Potassium: 3.7 mmol/L (ref 3.5–5.1)
Sodium: 139 mmol/L (ref 135–145)

## 2020-05-05 LAB — CBC WITH DIFFERENTIAL/PLATELET
Abs Immature Granulocytes: 0.01 10*3/uL (ref 0.00–0.07)
Basophils Absolute: 0 10*3/uL (ref 0.0–0.1)
Basophils Relative: 1 %
Eosinophils Absolute: 0.4 10*3/uL (ref 0.0–0.5)
Eosinophils Relative: 6 %
HCT: 29.2 % — ABNORMAL LOW (ref 36.0–46.0)
Hemoglobin: 9.6 g/dL — ABNORMAL LOW (ref 12.0–15.0)
Immature Granulocytes: 0 %
Lymphocytes Relative: 31 %
Lymphs Abs: 2.1 10*3/uL (ref 0.7–4.0)
MCH: 29.8 pg (ref 26.0–34.0)
MCHC: 32.9 g/dL (ref 30.0–36.0)
MCV: 90.7 fL (ref 80.0–100.0)
Monocytes Absolute: 0.6 10*3/uL (ref 0.1–1.0)
Monocytes Relative: 9 %
Neutro Abs: 3.6 10*3/uL (ref 1.7–7.7)
Neutrophils Relative %: 53 %
Platelets: 217 10*3/uL (ref 150–400)
RBC: 3.22 MIL/uL — ABNORMAL LOW (ref 3.87–5.11)
RDW: 13.4 % (ref 11.5–15.5)
WBC: 6.8 10*3/uL (ref 4.0–10.5)
nRBC: 0 % (ref 0.0–0.2)

## 2020-05-05 LAB — GLUCOSE, CAPILLARY
Glucose-Capillary: 81 mg/dL (ref 70–99)
Glucose-Capillary: 98 mg/dL (ref 70–99)
Glucose-Capillary: 87 mg/dL (ref 70–99)
Glucose-Capillary: 104 mg/dL — ABNORMAL HIGH (ref 70–99)

## 2020-05-05 LAB — MAGNESIUM
Magnesium: 1.8 mg/dL (ref 1.7–2.4)
Magnesium: 1.7 mg/dL (ref 1.7–2.4)
Magnesium: 1.7 mg/dL (ref 1.7–2.4)

## 2020-05-05 LAB — TSH: TSH: 0.128 u[IU]/mL — ABNORMAL LOW (ref 0.350–4.500)

## 2020-05-05 LAB — COMPREHENSIVE METABOLIC PANEL WITH GFR
ALT: 41 U/L (ref 0–44)
AST: 47 U/L — ABNORMAL HIGH (ref 15–41)
Albumin: 3.3 g/dL — ABNORMAL LOW (ref 3.5–5.0)
Alkaline Phosphatase: 57 U/L (ref 38–126)
Anion gap: 9 (ref 5–15)
BUN: 11 mg/dL (ref 6–20)
CO2: 28 mmol/L (ref 22–32)
Calcium: 6.7 mg/dL — ABNORMAL LOW (ref 8.9–10.3)
Chloride: 103 mmol/L (ref 98–111)
Creatinine, Ser: 0.69 mg/dL (ref 0.44–1.00)
GFR, Estimated: >60 mL/min
Glucose, Bld: 85 mg/dL (ref 70–99)
Potassium: 3.9 mmol/L (ref 3.5–5.1)
Sodium: 140 mmol/L (ref 135–145)
Total Bilirubin: 0.6 mg/dL (ref 0.3–1.2)
Total Protein: 6.8 g/dL (ref 6.5–8.1)

## 2020-05-05 LAB — HEPATIC FUNCTION PANEL
ALT: 41 U/L (ref 0–44)
AST: 47 U/L — ABNORMAL HIGH (ref 15–41)
Albumin: 3.3 g/dL — ABNORMAL LOW (ref 3.5–5.0)
Alkaline Phosphatase: 53 U/L (ref 38–126)
Bilirubin, Direct: <0.1 mg/dL (ref 0.0–0.2)
Total Bilirubin: 0.6 mg/dL (ref 0.3–1.2)
Total Protein: 6.7 g/dL (ref 6.5–8.1)

## 2020-05-05 LAB — LACTIC ACID, PLASMA: Lactic Acid, Venous: 0.9 mmol/L (ref 0.5–1.9)

## 2020-05-05 LAB — SURGICAL PATHOLOGY

## 2020-05-05 LAB — CALCIUM, IONIZED: Calcium, Ionized, Serum: 3.6 mg/dL — ABNORMAL LOW (ref 4.5–5.6)

## 2020-05-05 LAB — PHOSPHORUS: Phosphorus: 4.1 mg/dL (ref 2.5–4.6)

## 2020-05-05 LAB — CALCIUM: Calcium: 6.5 mg/dL — ABNORMAL LOW (ref 8.9–10.3)

## 2020-05-05 MED ORDER — MAGNESIUM SULFATE 2 GM/50ML IV SOLN
2.0000 g | Freq: Once | INTRAVENOUS | Status: AC
  Administered 2020-05-05: 2 g via INTRAVENOUS
  Filled 2020-05-05: qty 50

## 2020-05-05 MED ORDER — CALCIUM GLUCONATE-NACL 2-0.675 GM/100ML-% IV SOLN
2.0000 g | Freq: Once | INTRAVENOUS | Status: AC
  Administered 2020-05-05: 2000 mg via INTRAVENOUS
  Filled 2020-05-05: qty 100

## 2020-05-05 MED ORDER — METHOCARBAMOL 1000 MG/10ML IJ SOLN
500.0000 mg | Freq: Once | INTRAVENOUS | Status: AC
  Administered 2020-05-05: 500 mg via INTRAVENOUS
  Filled 2020-05-05: qty 500

## 2020-05-05 MED ORDER — SODIUM CHLORIDE 0.9 % IV BOLUS
500.0000 mL | Freq: Once | INTRAVENOUS | Status: AC
  Administered 2020-05-05: 500 mL via INTRAVENOUS

## 2020-05-05 MED ORDER — CALCITRIOL 0.5 MCG PO CAPS
0.5000 ug | ORAL_CAPSULE | Freq: Three times a day (TID) | ORAL | Status: DC
  Administered 2020-05-05 – 2020-05-08 (×10): 0.5 ug via ORAL
  Filled 2020-05-05 (×12): qty 1

## 2020-05-05 MED ORDER — VITAMIN D 25 MCG (1000 UNIT) PO TABS
1000.0000 [IU] | ORAL_TABLET | Freq: Three times a day (TID) | ORAL | Status: DC
  Administered 2020-05-05 – 2020-05-07 (×5): 1000 [IU] via ORAL
  Filled 2020-05-05 (×5): qty 1

## 2020-05-05 MED ORDER — SODIUM CHLORIDE 0.9 % IV SOLN: INTRAVENOUS | Status: AC

## 2020-05-05 NOTE — Progress Notes (Signed)
PROGRESS NOTE  Frances Davis EZM:[id number] DOB: 27-Nov-1980 DOA: 05/03/2020 PCP: Merrilee Seashore, MD  HPI/Recap of past 24 hours: HPI from Dr Mort Sawyers is a 40 y.o. female with medical history significant of anxiety, acquired hypothyroidism s/p thyroidectomy 2 days ago, GERD. Presenting with numbness X 1 day. She felt muscle tightness and cramping all over which make it difficult to move. She denies any pain; but does reports some tingling in several areas around her body. She became concerned and decided to come to the ED. Of note, she had a thyroidectomy on 05/01/20. She was sent home with levothyroxine and calcium. She reports compliance with these medications. In the ED, pt was found to have a calcium of 6.0. Her K+ was 3.1 and her Mg2+ was 1.6. EDP spoke with general surgery. They recommended medical admission due to concern for electrolyte disturbance. TRH was called for admission.     Overnight, patient noted to be anxious, short of breath, with stiffness noted in all upper extremities, which subsequently resolved.  Patient still with difficulty with replacing calcium.  Still reports generalized numbness, currently denies any chest pain, shortness of breath, abdominal pain, nausea/vomiting, fever/chills.   Assessment/Plan: Principal Problem:   Hypocalcemia   Hypocalcemia Symptomatic Likely 2/2 from thyroidectomy on 05/01/2020 Intact PTH, vitamin D pending Continue aggressive calcium replacements, may need continuous infusion of calcium Vs ?calcitriol Frequent calcium checks  Hypomagnesemia/hypokalemia Replace as needed  Acute blood loss anemia Likely 2/2 recent surgery Daily CBC  Hypothyroidism Continue Synthroid  Diabetes mellitus type 2, controlled Last A1c--> 5 on 04/23/2020 Continue SSI, Accu-Cheks, hypoglycemic protocol Hold home Ozempic  Mild hypotension Could be related to ?  Poor oral intake Lactic acid WNL Continue IV gentle hydration for  now  Depression/anxiety Continue home meds     Estimated body mass index is 27.9 kg/m as calculated from the following:   Height as of this encounter: 5' (1.524 m).   Weight as of this encounter: 64.8 kg.     Code Status: Full  Family Communication: Discussed with patient extensively  Disposition Plan: Status is: Inpatient  The patient will require care spanning > 2 midnights and should be moved to inpatient because: Inpatient level of care appropriate due to severity of illness  Dispo: The patient is from: Home              Anticipated d/c is to: Home              Patient currently is not medically stable to d/c.   Difficult to place patient No   Consultants:  General surgery  Procedures:  None  Antimicrobials:  None  DVT prophylaxis: Lovenox   Objective: Vitals:   05/05/20 0729 05/05/20 0800 05/05/20 1308 05/05/20 1335  BP: (!) 86/58 (!) 105/54 (!) 97/55 (!) 98/51  Pulse: 80 88 78 83  Resp: 15   14  Temp: 99 F (37.2 C)   98.6 F (37 C)  TempSrc: Oral   Oral  SpO2: 97%   99%  Weight:      Height:        Intake/Output Summary (Last 24 hours) at 05/05/2020 1535 Last data filed at 05/05/2020 1500 Gross per 24 hour  Intake 2544.17 ml  Output -  Net 2544.17 ml   Filed Weights   05/03/20 1537  Weight: 64.8 kg    Exam:  General: NAD, noted postop scar around neck, C/D/I  Cardiovascular: S1, S2 present  Respiratory: CTAB  Abdomen: Soft,  nontender, nondistended, bowel sounds present  Musculoskeletal: No bilateral pedal edema noted  Skin: Normal  Psychiatry: Normal mood    Data Reviewed: CBC: Recent Labs  Lab 05/03/20 1014 05/03/20 1519 05/04/20 0021 05/05/20 0829  WBC 10.6* 9.4 9.2 6.8  NEUTROABS 7.0  --   --  3.6  HGB 11.0* 10.3* 9.4* 9.6*  HCT 33.6* 31.3* 28.6* 29.2*  MCV 90.6 90.7 91.4 90.7  PLT 254 227 223 623   Basic Metabolic Panel: Recent Labs  Lab 05/02/20 0446 05/03/20 1014 05/03/20 1519 05/03/20 1808  05/04/20 0021 05/04/20 1320 05/04/20 2128 05/05/20 0829 05/05/20 1258  NA 138 141  --   --  142  --   --  139 140  K 4.4 3.1*  --   --  3.6  --   --  3.7 3.9  CL 105 100  --   --  104  --   --  103 103  CO2 26 30  --   --  28  --   --  26 28  GLUCOSE 89 97  --   --  93  --   --  88 85  BUN 12 15  --   --  12  --   --  11 11  CREATININE 0.80 0.96 0.65  --  0.68  --   --  0.72 0.69  CALCIUM 7.1* 6.0*  --    < > 5.9*  5.9* 6.3* 6.0* 5.8* 6.7*  MG  --  1.6*  --   --   --  1.3*  --  1.8 1.7  PHOS  --   --   --   --   --   --   --   --  4.1   < > = values in this interval not displayed.   GFR: Estimated Creatinine Clearance: 80.9 mL/min (by C-G formula based on SCr of 0.69 mg/dL). Liver Function Tests: Recent Labs  Lab 05/03/20 1014 05/04/20 0021 05/05/20 1258  AST 27 22 47*  ALT 17 15 41  ALKPHOS 37* 30* 57  BILITOT 0.6 0.6 0.6  PROT 7.4 5.5* 6.8  ALBUMIN 3.6 2.7* 3.3*   No results for input(s): LIPASE, AMYLASE in the last 168 hours. No results for input(s): AMMONIA in the last 168 hours. Coagulation Profile: No results for input(s): INR, PROTIME in the last 168 hours. Cardiac Enzymes: Recent Labs  Lab 05/03/20 1014  CKTOTAL 199   BNP (last 3 results) No results for input(s): PROBNP in the last 8760 hours. HbA1C: No results for input(s): HGBA1C in the last 72 hours. CBG: Recent Labs  Lab 05/04/20 1222 05/04/20 1626 05/04/20 2215 05/05/20 0754 05/05/20 1150  GLUCAP 91 92 84 81 98   Lipid Profile: No results for input(s): CHOL, HDL, LDLCALC, TRIG, CHOLHDL, LDLDIRECT in the last 72 hours. Thyroid Function Tests: Recent Labs    05/03/20 1014  TSH 0.537   Anemia Panel: No results for input(s): VITAMINB12, FOLATE, FERRITIN, TIBC, IRON, RETICCTPCT in the last 72 hours. Urine analysis:    Component Value Date/Time   COLORURINE YELLOW 05/03/2020 1014   APPEARANCEUR CLEAR 05/03/2020 1014   LABSPEC 1.014 05/03/2020 1014   PHURINE 5.0 05/03/2020 1014    GLUCOSEU NEGATIVE 05/03/2020 1014   [place] 05/03/2020 1014   [place] 05/03/2020 1014   KETONESUR 20 (A) 05/03/2020 1014   PROTEINUR NEGATIVE 05/03/2020 1014   NITRITE NEGATIVE 05/03/2020 1014   LEUKOCYTESUR NEGATIVE 05/03/2020 1014   Sepsis Labs: @  LABRCNTIP(procalcitonin:4,lacticidven:4)  ) Recent Results (from the past 240 hour(s))  SARS CORONAVIRUS 2 (TAT 6-24 HRS) Nasopharyngeal Nasopharyngeal Swab     Status: None   Collection Time: 04/28/20 10:45 AM   Specimen: Nasopharyngeal Swab  Result Value Ref Range Status   SARS Coronavirus 2 NEGATIVE NEGATIVE Final    Comment: (NOTE) SARS-CoV-2 target nucleic acids are NOT DETECTED.  The SARS-CoV-2 RNA is generally detectable in upper and lower respiratory specimens during the acute phase of infection. Negative results do not preclude SARS-CoV-2 infection, do not rule out co-infections with other pathogens, and should not be used as the sole basis for treatment or other patient management decisions. Negative results must be combined with clinical observations, patient history, and epidemiological information. The expected result is Negative.  Fact Sheet for Patients: SugarRoll.be  Fact Sheet for Healthcare Providers: https://www.woods-mathews.com/  This test is not yet approved or cleared by the [place] FDA and  has been authorized for detection and/or diagnosis of SARS-CoV-2 by FDA under an Emergency Use Authorization (EUA). This EUA will remain  in effect (meaning this test can be used) for the duration of the COVID-19 declaration under Se ction 564(b)(1) of the Act, 21 U.S.C. section 360bbb-3(b)(1), unless the authorization is terminated or revoked sooner.  Performed at [facility], [place] [address] [place], [place] [address]   Resp Panel by RT-PCR (Flu A&B, Covid) Nasopharyngeal Swab     Status: None   Collection Time: 05/03/20 11:49 AM    Specimen: Nasopharyngeal Swab; Nasopharyngeal(NP) swabs in vial transport medium  Result Value Ref Range Status   SARS Coronavirus 2 by RT PCR NEGATIVE NEGATIVE Final    Comment: (NOTE) SARS-CoV-2 target nucleic acids are NOT DETECTED.  The SARS-CoV-2 RNA is generally detectable in upper respiratory specimens during the acute phase of infection. The lowest concentration of SARS-CoV-2 viral copies this assay can detect is 138 copies/mL. A negative result does not preclude SARS-Cov-2 infection and should not be used as the sole basis for treatment or other patient management decisions. A negative result may occur with  improper specimen collection/handling, submission of specimen other than nasopharyngeal swab, presence of viral mutation(s) within the areas targeted by this assay, and inadequate number of viral copies(<138 copies/mL). A negative result must be combined with clinical observations, patient history, and epidemiological information. The expected result is Negative.  Fact Sheet for Patients:  EntrepreneurPulse.com.au  Fact Sheet for Healthcare Providers:  IncredibleEmployment.be  This test is no t yet approved or cleared by the [place] FDA and  has been authorized for detection and/or diagnosis of SARS-CoV-2 by FDA under an Emergency Use Authorization (EUA). This EUA will remain  in effect (meaning this test can be used) for the duration of the COVID-19 declaration under Section 564(b)(1) of the Act, 21 U.S.C.section 360bbb-3(b)(1), unless the authorization is terminated  or revoked sooner.       Influenza A by PCR NEGATIVE NEGATIVE Final   Influenza B by PCR NEGATIVE NEGATIVE Final    Comment: (NOTE) The Xpert Xpress SARS-CoV-2/FLU/RSV plus assay is intended as an aid in the diagnosis of influenza from Nasopharyngeal swab specimens and should not be used as a sole basis for treatment. Nasal washings and aspirates are  unacceptable for Xpert Xpress SARS-CoV-2/FLU/RSV testing.  Fact Sheet for Patients: EntrepreneurPulse.com.au  Fact Sheet for Healthcare Providers: IncredibleEmployment.be  This test is not yet approved or cleared by the [place] FDA and has been authorized for detection and/or diagnosis of SARS-CoV-2 by  FDA under an Emergency Use Authorization (EUA). This EUA will remain in effect (meaning this test can be used) for the duration of the COVID-19 declaration under Section 564(b)(1) of the Act, 21 U.S.C. section 360bbb-3(b)(1), unless the authorization is terminated or revoked.  Performed at [facility], [place] [address], [place], [place] [address]       Studies: No results found.  Scheduled Meds: . calcium-vitamin D  2 tablet Oral TID  . divalproex  500 mg Oral Daily  . enoxaparin (LOVENOX) injection  40 mg Subcutaneous Q24H  . estradiol  1 mg Oral Daily  . insulin aspart  0-6 Units Subcutaneous TID WC  . levothyroxine  100 mcg Oral Daily  . methocarbamol  750 mg Oral Daily  . multivitamin with minerals  1 tablet Oral Daily  . polyethylene glycol  17 g Oral Daily  . QUEtiapine  100 mg Oral QHS  . senna-docusate  1 tablet Oral BID  . sertraline  100 mg Oral QHS  . valACYclovir  500 mg Oral Daily    Continuous Infusions:    LOS: 1 day     Alma Friendly, MD [facility]  If 7PM-7AM, please contact night-coverage [email or web address] 05/05/2020, 3:35 PM

## 2020-05-05 NOTE — Progress Notes (Addendum)
   Subjective/Chief Complaint: Events of weekend noted Patient still with hypocalcemia   Objective: Vital signs in last 24 hours: Temp:  [97.7 F (36.5 C)-99 F (37.2 C)] 98.6 F (37 C) (03/21 1335) Pulse Rate:  [78-112] 83 (03/21 1335) Resp:  [14-20] 14 (03/21 1335) BP: (81-112)/(46-78) 98/51 (03/21 1335) SpO2:  [97 %-100 %] 99 % (03/21 1335) Last BM Date: 05/04/20  Intake/Output from previous day: 03/20 0701 - 03/21 0700 In: 1960 [P.O.:960; I.V.:1000] Out: -  Intake/Output this shift: Total I/O In: 1251.7 [P.O.:480; I.V.:671.7; IV Piggyback:100] Out: -   Exam: Awake and alert CV RRR Incision clean, no hematoma  Lab Results:  Recent Labs    05/04/20 0021 05/05/20 0829  WBC 9.2 6.8  HGB 9.4* 9.6*  HCT 28.6* 29.2*  PLT 223 217   BMET Recent Labs    05/05/20 0829 05/05/20 1258  NA 139 140  K 3.7 3.9  CL 103 103  CO2 26 28  GLUCOSE 88 85  BUN 11 11  CREATININE 0.72 0.69  CALCIUM 5.8* 6.7*   PT/INR No results for input(s): LABPROT, INR in the last 72 hours. ABG No results for input(s): PHART, HCO3 in the last 72 hours.  Invalid input(s): PCO2, PO2  Studies/Results: No results found.  Anti-infectives: Anti-infectives (From admission, onward)   Start     Dose/Rate Route Frequency Ordered Stop   05/04/20 1000  valACYclovir (VALTREX) tablet 500 mg        500 mg Oral Daily 05/03/20 1455        Assessment/Plan: Hypocalcemia s/p total thyroidectomy  Continue calcium replacement May need endocrinology consult Final path shows probable Hashimoto's thyroiditis  Greatly appreciate Hospitalist's care   LOS: 1 day    Coralie Keens 05/05/2020

## 2020-05-05 NOTE — Progress Notes (Signed)
Patient experiencing another episode similar to last night (same time). Patient is very stiff, unable to unclench fists, tingling from head to toe, sweating, etc. Pt. reports this episode is worse than last night.  MD notified. Vitals obtained and were WNL. Calcium 2g IVPB, Robaxin IVPB administered, 02 2l applied for comfort, seizure pads in place. MD arrived to floor. We will check TSH, LFT. We will monitor patient's labs and see if she needs to have continuous calcium infusion per MD. May need to transfer if symptoms do not improve. EKG being done at this time. Information will be given to oncoming RN Brooke. Roderick Pee

## 2020-05-05 NOTE — Progress Notes (Signed)
Around 2015 pm patient began feeling flushed, anxious, short of breath, and extremely stiff. Patient could not relax her fingers from a fist position. She also complained of chest discomfort that was sharp and going through to the back. Magnesium IV was finishing up infusing at the time.  Vitals appeared normal, except for slight tachycardia.  Provider on call and Rapid response were notified. EKG was obtained. Patient was given Robaxin IV, O2 via [place] 2L for comfort, 2g of calcium gluconate.  Seizure pads were placed on bed also.  Rapid response came to bedside. Patient began to feel much better after IVPB robaxin was administered. Patient had a low calcium of 6.0. This was checked right before administering the 2g bag of calcium gluconate.  [place] (on call provider) was made aware. She said we will obtain labs in am to recheck calcium.  Patient is now comfortable at this time. Will continue to monitor.Roderick Pee

## 2020-05-05 NOTE — Progress Notes (Signed)
Lab tech called this RN with critical value of calcium 5.8 at 0854. This RN made MD aware face to face at [phone]. Awaiting new orders.

## 2020-05-06 LAB — CBC WITH DIFFERENTIAL/PLATELET
Abs Immature Granulocytes: 0.01 10*3/uL (ref 0.00–0.07)
Basophils Absolute: 0 10*3/uL (ref 0.0–0.1)
Basophils Relative: 1 %
Eosinophils Absolute: 0.5 10*3/uL (ref 0.0–0.5)
Eosinophils Relative: 7 %
HCT: 32.1 % — ABNORMAL LOW (ref 36.0–46.0)
Hemoglobin: 10.6 g/dL — ABNORMAL LOW (ref 12.0–15.0)
Immature Granulocytes: 0 %
Lymphocytes Relative: 34 %
Lymphs Abs: 2.7 10*3/uL (ref 0.7–4.0)
MCH: 29.9 pg (ref 26.0–34.0)
MCHC: 33 g/dL (ref 30.0–36.0)
MCV: 90.7 fL (ref 80.0–100.0)
Monocytes Absolute: 0.5 10*3/uL (ref 0.1–1.0)
Monocytes Relative: 6 %
Neutro Abs: 4.1 10*3/uL (ref 1.7–7.7)
Neutrophils Relative %: 52 %
Platelets: 266 10*3/uL (ref 150–400)
RBC: 3.54 MIL/uL — ABNORMAL LOW (ref 3.87–5.11)
RDW: 13.3 % (ref 11.5–15.5)
WBC: 7.9 10*3/uL (ref 4.0–10.5)
nRBC: 0 % (ref 0.0–0.2)

## 2020-05-06 LAB — COMPREHENSIVE METABOLIC PANEL
ALT: 33 U/L (ref 0–44)
AST: 27 U/L (ref 15–41)
Albumin: 2.9 g/dL — ABNORMAL LOW (ref 3.5–5.0)
Alkaline Phosphatase: 50 U/L (ref 38–126)
Anion gap: 8 (ref 5–15)
BUN: 14 mg/dL (ref 6–20)
CO2: 26 mmol/L (ref 22–32)
Calcium: 6.5 mg/dL — ABNORMAL LOW (ref 8.9–10.3)
Chloride: 103 mmol/L (ref 98–111)
Creatinine, Ser: 0.76 mg/dL (ref 0.44–1.00)
GFR, Estimated: >60 mL/min (ref >60)
Glucose, Bld: 81 mg/dL (ref 70–99)
Potassium: 3.7 mmol/L (ref 3.5–5.1)
Sodium: 137 mmol/L (ref 135–145)
Total Bilirubin: 0.5 mg/dL (ref 0.3–1.2)
Total Protein: 5.9 g/dL — ABNORMAL LOW (ref 6.5–8.1)

## 2020-05-06 LAB — CALCIUM, IONIZED: Calcium, Ionized, Serum: 3.6 mg/dL — ABNORMAL LOW (ref 4.5–5.6)

## 2020-05-06 LAB — GLUCOSE, CAPILLARY
Glucose-Capillary: 79 mg/dL (ref 70–99)
Glucose-Capillary: 78 mg/dL (ref 70–99)
Glucose-Capillary: 85 mg/dL (ref 70–99)
Glucose-Capillary: 93 mg/dL (ref 70–99)
Glucose-Capillary: 81 mg/dL (ref 70–99)

## 2020-05-06 LAB — CALCIUM
Calcium: 6.8 mg/dL — ABNORMAL LOW (ref 8.9–10.3)
Calcium: 7.7 mg/dL — ABNORMAL LOW (ref 8.9–10.3)

## 2020-05-06 LAB — PTH, INTACT AND CALCIUM
Calcium, Total (PTH): 6.5 mg/dL (ref 8.7–10.2)
PTH: 9 pg/mL — ABNORMAL LOW (ref 15–65)

## 2020-05-06 LAB — MAGNESIUM
Magnesium: 1.5 mg/dL — ABNORMAL LOW (ref 1.7–2.4)
Magnesium: 1.7 mg/dL (ref 1.7–2.4)

## 2020-05-06 MED ORDER — METHOCARBAMOL 500 MG PO TABS
750.0000 mg | ORAL_TABLET | Freq: Three times a day (TID) | ORAL | Status: DC | PRN
  Administered 2020-05-06 – 2020-05-08 (×3): 750 mg via ORAL
  Filled 2020-05-06 (×3): qty 2

## 2020-05-06 MED ORDER — MAGNESIUM OXIDE 400 (241.3 MG) MG PO TABS
400.0000 mg | ORAL_TABLET | Freq: Two times a day (BID) | ORAL | Status: DC
  Administered 2020-05-06 – 2020-05-09 (×6): 400 mg via ORAL
  Filled 2020-05-06 (×6): qty 1

## 2020-05-06 MED ORDER — MAGNESIUM SULFATE 4 GM/100ML IV SOLN: 4.0000 g | Freq: Once | INTRAVENOUS | Status: DC

## 2020-05-06 MED ORDER — SODIUM CHLORIDE 0.9 % IV BOLUS
1000.0000 mL | Freq: Once | INTRAVENOUS | Status: AC
  Administered 2020-05-06: 1000 mL via INTRAVENOUS

## 2020-05-06 MED ORDER — CALCIUM GLUCONATE-NACL 2-0.675 GM/100ML-% IV SOLN
2.0000 g | Freq: Three times a day (TID) | INTRAVENOUS | Status: AC
  Administered 2020-05-06 – 2020-05-07 (×3): 2000 mg via INTRAVENOUS
  Filled 2020-05-06 (×3): qty 100

## 2020-05-06 MED ORDER — MAGNESIUM SULFATE 4 GM/100ML IV SOLN
4.0000 g | Freq: Once | INTRAVENOUS | Status: AC
  Administered 2020-05-06: 4 g via INTRAVENOUS
  Filled 2020-05-06: qty 100

## 2020-05-06 MED ORDER — CALCIUM GLUCONATE-NACL 2-0.675 GM/100ML-% IV SOLN: 2.0000 g | Freq: Once | INTRAVENOUS | Status: DC

## 2020-05-06 NOTE — Progress Notes (Signed)
Rapid Response Event Note   Reason for Call :  symptomatic low Ca+ causing muscle cramping in mouth, eyelids, feet & hands bilaterally. HR 163s and pt feels SOB  Initial Focused Assessment:  Pt having intense muscular cramping in toes, feet, fingers, hands. On arrival IV robaxin and IV Ca+ were running. The RN & TRH MD was in the room doing assessment.   Interventions:  EKG showed NSR w/ HR 92bpm RR RN gave pt a spoon full of yellow mustard to accompany IV Ca pt was already receiving Lungs are clear bilaterally & no abnormal heart sounds heard. Pt was 100% on 2L  Event Summary:  Pt is okay to stay OTF. RR RN will continue to monitor remotely  MD Notified: [place] on call Call Time: 2110p Arrival Time: 2122p End Time: 2158p  Quincy Simmonds, RN

## 2020-05-06 NOTE — Progress Notes (Signed)
Patient resting comfortably right now. Will continue to monitor patient throughout rest of shift.

## 2020-05-06 NOTE — Progress Notes (Signed)
PROGRESS NOTE  Frances Davis TKP:[id number] DOB: [birth year] DOA: 05/03/2020 PCP: Merrilee Seashore, MD  HPI/Recap of past 24 hours: HPI from Dr Mort Sawyers is a 40 y.o. female with medical history significant of anxiety, acquired hypothyroidism s/p thyroidectomy 2 days ago, GERD. Presenting with numbness X 1 day. She felt muscle tightness and cramping all over which make it difficult to move. She denies any pain; but does reports some tingling in several areas around her body. She became concerned and decided to come to the ED. Of note, she had a thyroidectomy on 05/01/20. She was sent home with levothyroxine and calcium. She reports compliance with these medications. In the ED, pt was found to have a calcium of 6.0. Her K+ was 3.1 and her Mg2+ was 1.6. EDP spoke with general surgery. They recommended medical admission due to concern for electrolyte disturbance. TRH was called for admission.      Overnight, patient had another episode of muscle spasm, generalized weakness/numbness.  Still complaining of generalized numbness as of today, anxiety as well.     Assessment/Plan: Principal Problem:   Hypocalcemia    Symptomatic Hypocalcemia Possible hypoparathyroidism likely from recent surgery Likely 2/2 from thyroidectomy on 05/01/2020 Intact PTH low, vitamin D low Continue aggressive calcium replacements: Calcitriol, IV calcium gluconate, elemental calcium Discussed above plan with nephrologist Dr. Jonnie Finner on 05/06/2020, please call him daily with update to adjust calcitriol dosing pending calcium levels Continue vitamin D supplementation Frequent calcium checks  Hypomagnesemia/hypokalemia Replace as needed  Acute blood loss anemia Likely 2/2 recent surgery Daily CBC  Hypothyroidism TSH 0.128 on 05/05/2020 Continue Synthroid, may need dose adjustments  Diabetes mellitus type 2, controlled Last A1c--> 5 on 04/23/2020 Continue SSI, Accu-Cheks, hypoglycemic  protocol Hold home Ozempic  Mild hypotension Could be related to ?  Poor oral intake Lactic acid WNL Continue IV gentle hydration for now  Depression/anxiety Continue home meds     Estimated body mass index is 27.9 kg/m as calculated from the following:   Height as of this encounter: 5' (1.524 m).   Weight as of this encounter: 64.8 kg.     Code Status: Full  Family Communication: Discussed with patient extensively  Disposition Plan: Status is: Inpatient  The patient will require care spanning > 2 midnights and should be moved to inpatient because: Inpatient level of care appropriate due to severity of illness  Dispo: The patient is from: Home              Anticipated d/c is to: Home              Patient currently is not medically stable to d/c.   Difficult to place patient No   Consultants:  General surgery  Discussed with nephrology  Procedures:  None  Antimicrobials:  None  DVT prophylaxis: Lovenox   Objective: Vitals:   05/06/20 0542 05/06/20 0804 05/06/20 1021 05/06/20 1357  BP: (!) 98/44 (!) 83/43 (!) 106/59 106/63  Pulse: 70 85 80 (!) 102  Resp: 18 15 16 15   Temp: (!) 97.5 F (36.4 C) 97.9 F (36.6 C) 98.9 F (37.2 C) 98.6 F (37 C)  TempSrc: Oral Oral Oral Oral  SpO2: 100% 100% 100% 100%  Weight:      Height:        Intake/Output Summary (Last 24 hours) at 05/06/2020 1614 Last data filed at 05/06/2020 0900 Gross per 24 hour  Intake 1369.43 ml  Output -  Net 1369.43 ml   [employer]  05/03/20 1537  Weight: 64.8 kg    Exam:  General: NAD, noted postop scar around neck, C/D/I  Cardiovascular: S1, S2 present  Respiratory: CTAB  Abdomen: Soft, nontender, nondistended, bowel sounds present  Musculoskeletal: No bilateral pedal edema noted  Skin: Normal  Psychiatry: Normal mood  Neurology: No obvious focal neurologic deficits noted    Data Reviewed: CBC: Recent Labs  Lab 05/03/20 1014 05/03/20 1519  05/04/20 0021 05/05/20 0829 05/06/20 0834  WBC 10.6* 9.4 9.2 6.8 7.9  NEUTROABS 7.0  --   --  3.6 4.1  HGB 11.0* 10.3* 9.4* 9.6* 10.6*  HCT 33.6* 31.3* 28.6* 29.2* 32.1*  MCV 90.6 90.7 91.4 90.7 90.7  PLT 254 227 223 217 270   Basic Metabolic Panel: Recent Labs  Lab 05/04/20 0021 05/04/20 1320 05/04/20 2128 05/05/20 0829 05/05/20 1258 05/05/20 2045 05/05/20 2047 05/06/20 0655 05/06/20 1409  NA 142  --   --  139 140  --  141 137  --   K 3.6  --   --  3.7 3.9  --  5.0 3.7  --   CL 104  --   --  103 103  --  100 103  --   CO2 28  --   --  26 28  --  27 26  --   GLUCOSE 93  --   --  88 85  --  92 81  --   BUN 12  --   --  11 11  --  13 14  --   CREATININE 0.68  --   --  0.72 0.69  --  0.76 0.76  --   CALCIUM 5.9*  5.9* 6.3*   < > 5.8* 6.7*  6.5 6.5* 6.6* 6.5* 6.8*  MG  --  1.3*  --  1.8 1.7  --  1.7  --  1.5*  PHOS  --   --   --   --  4.1  --   --   --   --    < > = values in this interval not displayed.   GFR: Estimated Creatinine Clearance: 80.9 mL/min (by C-G formula based on SCr of 0.76 mg/dL). Liver Function Tests: Recent Labs  Lab 05/03/20 1014 05/04/20 0021 05/05/20 1258 05/05/20 2047 05/06/20 0655  AST 27 22 47* 47* 27  ALT 17 15 41 41 33  ALKPHOS 37* 30* 57 53 50  BILITOT 0.6 0.6 0.6 0.6 0.5  PROT 7.4 5.5* 6.8 6.7 5.9*  ALBUMIN 3.6 2.7* 3.3* 3.3* 2.9*   No results for input(s): LIPASE, AMYLASE in the last 168 hours. No results for input(s): AMMONIA in the last 168 hours. Coagulation Profile: No results for input(s): INR, PROTIME in the last 168 hours. Cardiac Enzymes: Recent Labs  Lab 05/03/20 1014  CKTOTAL 199   BNP (last 3 results) No results for input(s): PROBNP in the last 8760 hours. HbA1C: No results for input(s): HGBA1C in the last 72 hours. CBG: Recent Labs  Lab 05/05/20 1645 05/05/20 2148 05/06/20 0805 05/06/20 1120 05/06/20 1148  GLUCAP 87 104* 79 78 85   Lipid Profile: No results for input(s): CHOL, HDL, LDLCALC, TRIG,  CHOLHDL, LDLDIRECT in the last 72 hours. Thyroid Function Tests: Recent Labs    05/05/20 2047  TSH 0.128*   Anemia Panel: No results for input(s): VITAMINB12, FOLATE, FERRITIN, TIBC, IRON, RETICCTPCT in the last 72 hours. Urine analysis:    Component Value Date/Time   COLORURINE YELLOW 05/03/2020 1014   APPEARANCEUR  CLEAR 05/03/2020 1014   LABSPEC 1.014 05/03/2020 1014   PHURINE 5.0 05/03/2020 1014   [place] 05/03/2020 1014   [place] 05/03/2020 [place] 05/03/2020 1014   KETONESUR 20 (A) 05/03/2020 1014   PROTEINUR NEGATIVE 05/03/2020 1014   NITRITE NEGATIVE 05/03/2020 1014   LEUKOCYTESUR NEGATIVE 05/03/2020 1014   Sepsis Labs: @LABRCNTIP (procalcitonin:4,lacticidven:4)  ) Recent Results (from the past 240 hour(s))  SARS CORONAVIRUS 2 (TAT 6-24 HRS) Nasopharyngeal Nasopharyngeal Swab     Status: None   Collection Time: 04/28/20 10:45 AM   Specimen: Nasopharyngeal Swab  Result Value Ref Range Status   SARS Coronavirus 2 NEGATIVE NEGATIVE Final    Comment: (NOTE) SARS-CoV-2 target nucleic acids are NOT DETECTED.  The SARS-CoV-2 RNA is generally detectable in upper and lower respiratory specimens during the acute phase of infection. Negative results do not preclude SARS-CoV-2 infection, do not rule out co-infections with other pathogens, and should not be used as the sole basis for treatment or other patient management decisions. Negative results must be combined with clinical observations, patient history, and epidemiological information. The expected result is Negative.  Fact Sheet for Patients: SugarRoll.be  Fact Sheet for Healthcare Providers: https://www.woods-mathews.com/  This test is not yet approved or cleared by the [place] FDA and  has been authorized for detection and/or diagnosis of SARS-CoV-2 by FDA under an Emergency Use Authorization (EUA). This EUA will remain  in  effect (meaning this test can be used) for the duration of the COVID-19 declaration under Se ction 564(b)(1) of the Act, 21 U.S.C. section 360bbb-3(b)(1), unless the authorization is terminated or revoked sooner.  Performed at [facility], [place] [address] [place], [place] [address]   Resp Panel by RT-PCR (Flu A&B, Covid) Nasopharyngeal Swab     Status: None   Collection Time: 05/03/20 11:49 AM   Specimen: Nasopharyngeal Swab; Nasopharyngeal(NP) swabs in vial transport medium  Result Value Ref Range Status   SARS Coronavirus 2 by RT PCR NEGATIVE NEGATIVE Final    Comment: (NOTE) SARS-CoV-2 target nucleic acids are NOT DETECTED.  The SARS-CoV-2 RNA is generally detectable in upper respiratory specimens during the acute phase of infection. The lowest concentration of SARS-CoV-2 viral copies this assay can detect is 138 copies/mL. A negative result does not preclude SARS-Cov-2 infection and should not be used as the sole basis for treatment or other patient management decisions. A negative result may occur with  improper specimen collection/handling, submission of specimen other than nasopharyngeal swab, presence of viral mutation(s) within the areas targeted by this assay, and inadequate number of viral copies(<138 copies/mL). A negative result must be combined with clinical observations, patient history, and epidemiological information. The expected result is Negative.  Fact Sheet for Patients:  EntrepreneurPulse.com.au  Fact Sheet for Healthcare Providers:  IncredibleEmployment.be  This test is no t yet approved or cleared by the [place] FDA and  has been authorized for detection and/or diagnosis of SARS-CoV-2 by FDA under an Emergency Use Authorization (EUA). This EUA will remain  in effect (meaning this test can be used) for the duration of the COVID-19 declaration under Section 564(b)(1) of the Act, 21 U.S.C.section  360bbb-3(b)(1), unless the authorization is terminated  or revoked sooner.       Influenza A by PCR NEGATIVE NEGATIVE Final   Influenza B by PCR NEGATIVE NEGATIVE Final    Comment: (NOTE) The Xpert Xpress SARS-CoV-2/FLU/RSV plus assay is intended as an aid in the diagnosis of influenza from Nasopharyngeal  swab specimens and should not be used as a sole basis for treatment. Nasal washings and aspirates are unacceptable for Xpert Xpress SARS-CoV-2/FLU/RSV testing.  Fact Sheet for Patients: EntrepreneurPulse.com.au  Fact Sheet for Healthcare Providers: IncredibleEmployment.be  This test is not yet approved or cleared by the [place] FDA and has been authorized for detection and/or diagnosis of SARS-CoV-2 by FDA under an Emergency Use Authorization (EUA). This EUA will remain in effect (meaning this test can be used) for the duration of the COVID-19 declaration under Section 564(b)(1) of the Act, 21 U.S.C. section 360bbb-3(b)(1), unless the authorization is terminated or revoked.  Performed at [facility], [place] [address] [place], [place] [address]   Culture, blood (routine x 2)     Status: None (Preliminary result)   Collection Time: 05/05/20 12:58 PM   Specimen: BLOOD  Result Value Ref Range Status   Specimen Description   Final    BLOOD LEFT ANTECUBITAL Performed at [place] [address] [place], [place] [address]    Special Requests   Final    BOTTLES DRAWN AEROBIC ONLY Blood Culture adequate volume Performed at [place] [facility] [address], [place], [place] [address]    Culture   Final    NO GROWTH < 24 HOURS Performed at [place] [address] [place], [place] [address]    Report Status PENDING  Incomplete  Culture, blood (routine x 2)     Status: None (Preliminary result)   Collection Time: 05/05/20 12:58 PM   Specimen: BLOOD  Result Value Ref  Range Status   Specimen Description   Final    BLOOD RIGHT ANTECUBITAL Performed at [facility] [address] [place], [place] [address]    Special Requests   Final    BOTTLES DRAWN AEROBIC ONLY Blood Culture adequate volume Performed at [facility] [address] [place], [place] [address]    Culture   Final    NO GROWTH < 24 HOURS Performed at [facility] [address], [place], [place] [address]    Report Status PENDING  Incomplete      Studies: No results found.  Scheduled Meds: . calcitRIOL  0.5 mcg Oral TID  . calcium-vitamin D  2 tablet Oral TID  . cholecalciferol  1,000 Units Oral TID  . divalproex  500 mg Oral Daily  . enoxaparin (LOVENOX) injection  40 mg Subcutaneous Q24H  . estradiol  1 mg Oral Daily  . insulin aspart  0-6 Units Subcutaneous TID WC  . levothyroxine  100 mcg Oral Daily  . magnesium oxide  400 mg Oral BID  . multivitamin with minerals  1 tablet Oral Daily  . polyethylene glycol  17 g Oral Daily  . QUEtiapine  100 mg Oral QHS  . senna-docusate  1 tablet Oral BID  . sertraline  100 mg Oral QHS  . valACYclovir  500 mg Oral Daily    Continuous Infusions: . sodium chloride 75 mL/hr at 05/06/20 0606  . calcium gluconate 2,000 mg (05/06/20 1554)  . magnesium sulfate bolus IVPB       LOS: 2 days     Alma Friendly, MD [facility]  If 7PM-7AM, please contact night-coverage [email or web address] 05/06/2020, 4:14 PM

## 2020-05-06 NOTE — Progress Notes (Addendum)
Patient reporting feeling hot and stated this is the same she felt the past two nights when her calcium went low. MD contacted and ordered calcium gluconate infusion. Infusing now. Patient reporting feeling like she is going to pass out. Scheduled PO meds given. Charge RN aware.

## 2020-05-06 NOTE — Progress Notes (Signed)
Rapid Response Event Note   Reason for Call :  Symptomatic low Ca+  Initial Focused Assessment:  On arrival pt is having intense muscular cramping in her hands, fingers, toes. Pt also reported mild chest pain. Ca+ has been persistently low despite Ca+ replacements IV & PO.   Interventions:  EKG showed NSR in 90s  Seizure pads placed on bed IV Robaxin & IV Ca+ gluconate   Event Summary:  Pt is okay to stay OTF. With replacement symptoms will go away. RR RN will continue to monitor remotely  MD Notified: [place] on call prior to arrival Call Time: 2023p Arrival Time: 2035p End Time: 2100p  Quincy Simmonds, RN

## 2020-05-06 NOTE — Progress Notes (Signed)
   Subjective/Chief Complaint: Events of night noted She feels much better now Only intermittent tingling Denies neck/incisional pain No issues swallowing   Objective: Vital signs in last 24 hours: Temp:  [97.5 F (36.4 C)-98.9 F (37.2 C)] 98.6 F (37 C) (03/22 1357) Pulse Rate:  [70-109] 102 (03/22 1357) Resp:  [15-18] 15 (03/22 1357) BP: (83-149)/(43-79) 106/63 (03/22 1357) SpO2:  [99 %-100 %] 100 % (03/22 1357) Last BM Date: 05/06/20  Intake/Output from previous day: 03/21 0701 - 03/22 0700 In: 2381.1 [P.O.:480; I.V.:1549.2; IV Piggyback:351.9] Out: -  Intake/Output this shift: Total I/O In: 720 [P.O.:720] Out: -   Exam: Awake and alert Looks much more comfortable Neck incision clean, no swelling Voice normal  Lab Results:  Recent Labs    05/05/20 0829 05/06/20 0834  WBC 6.8 7.9  HGB 9.6* 10.6*  HCT 29.2* 32.1*  PLT 217 266   BMET Recent Labs    05/05/20 2047 05/06/20 0655 05/06/20 1409  NA 141 137  --   K 5.0 3.7  --   CL 100 103  --   CO2 27 26  --   GLUCOSE 92 81  --   BUN 13 14  --   CREATININE 0.76 0.76  --   CALCIUM 6.6* 6.5* 6.8*   PT/INR No results for input(s): LABPROT, INR in the last 72 hours. ABG No results for input(s): PHART, HCO3 in the last 72 hours.  Invalid input(s): PCO2, PO2  Studies/Results: No results found.  Anti-infectives: Anti-infectives (From admission, onward)   Start     Dose/Rate Route Frequency Ordered Stop   05/04/20 1000  valACYclovir (VALTREX) tablet 500 mg        500 mg Oral Daily 05/03/20 1455        Assessment/Plan: Post total thyroidectomy hypocalcemia  Slowly improving with current treatment. Continuing plans as outlined by hospitalists Appreciate their care of Frances Davis    LOS: 2 days    Frances Davis 05/06/2020

## 2020-05-06 NOTE — Significant Event (Signed)
Patient had another episode of muscle spasm typical of hypocalcemia.  2 g IV calcium gluconate was given patient also had transient episode of tachycardia.  At the time of my arrival heart rate has come back down to 110 bpm.  Patient has weakness all extremities but has good deep tendon reflexes.  Discussed with on-call nephrologist Dr. Johnney Ou who advised placing patient on calcitriol which I have ordered 0.5 mcg 3 times daily and also vitamin D3 tablets.  We will be rechecking stat metabolic panel and magnesium levels TSH and albumin levels and EKG.  Patient's Calcitrol dose may need to be readjusted or decreased accordingly and may reconsult nephrology in the morning.  Gean Birchwood

## 2020-05-06 NOTE — Significant Event (Signed)
Rapid Response Event Note   Reason for Call :  Primary RN called Rapid response nurse at 2033 while writer on another call and then called the ICU charge phone at 2037 to report patient c/o feeling hot, anxiety, stiff muscles, and shaky. Patient has previously experienced worse degree of same symptoms that required rapid response. Rapid response nurse explained reason for missed call five minutes prior and went to see patient.  Initial Focused Assessment:  From physician documentation: "Frances Davis is a 40 y.o. female with medical history significant of anxiety, acquired hypothyroidism s/p thyroidectomy 2 days ago, GERD. Presenting with numbness X 1 day. She felt muscle tightness and cramping all over which make it difficult to move. She denies any pain; but does reports some tingling in several areas around her body. She became concerned and decided to come to the ED. Of note, she had a thyroidectomy on 05/01/20. She was sent home with levothyroxine and calcium. She reports compliance with these medications. In the ED, pt was found to have a calcium of 6.0. Her K+ was 3.1 and her Mg2+ was 1.6. EDP spoke with general surgery. They recommended medical admission due to concern for electrolyte disturbance. TRH was called for admission.  Overnight, patient had another episode of muscle spasm, generalized weakness/numbness.  Still complaining of generalized numbness as of today, anxiety as well."   On my assessment, patient is alert, oriented, awake, vitals within goal range, capillary blood sugar is 81, and patient states she feels hot and tingling. Patient has three layers of covers with two of those layers being large blankets. Primary RN has administered all prescribed calcium infusions, oral calcium replacements, prn Robaxin, and Atarax for anxiety (see MAR). Observed patient is trembling slightly, but able to move all four extremities equally. Primary RN reports that she previously educated patient to  report symptoms at onset so that prompt intervention could prevent worsening severity. From writer's perspective, this education was followed and RN acted appropriately.   Interventions:  Removed thick blankets from patient and offered fan to ease hot flashes. Reviewed patient chart further including MAR and orders after assessment. Advised RN to administer prescribed prn bedtime dose of Valium if anxiety persists. Provided education to patient and primary RN about disease process related to thyroidectomy. Explained that labs drawn no less than daily to monitor electrolytes. Advised that I was unable to explain why her symptoms seem to be occurring at night. Possible related to circadian rhythms, but unsure; advised to ask physician to clarify.   Plan of Care:  Advised primary RN to recheck vitals in four hours or sooner prn. Continue monitoring effectiveness of medication interventions. Call Rapid Response RN if worsening or ineffectiveness of interventions.   Event Summary:   MD Notified: by primary RN Call Time: 2037 Arrival Time: 2045 End Time: 2102  Selinda Michaels, RN

## 2020-05-07 LAB — CBC WITH DIFFERENTIAL/PLATELET
Abs Immature Granulocytes: 0.02 10*3/uL (ref 0.00–0.07)
Basophils Absolute: 0.1 10*3/uL (ref 0.0–0.1)
Basophils Relative: 1 %
Eosinophils Absolute: 0.5 10*3/uL (ref 0.0–0.5)
Eosinophils Relative: 6 %
HCT: 30.2 % — ABNORMAL LOW (ref 36.0–46.0)
Hemoglobin: 9.9 g/dL — ABNORMAL LOW (ref 12.0–15.0)
Immature Granulocytes: 0 %
Lymphocytes Relative: 35 %
Lymphs Abs: 3.1 10*3/uL (ref 0.7–4.0)
MCH: 29.5 pg (ref 26.0–34.0)
MCHC: 32.8 g/dL (ref 30.0–36.0)
MCV: 89.9 fL (ref 80.0–100.0)
Monocytes Absolute: 0.8 10*3/uL (ref 0.1–1.0)
Monocytes Relative: 9 %
Neutro Abs: 4.4 10*3/uL (ref 1.7–7.7)
Neutrophils Relative %: 49 %
Platelets: 271 10*3/uL (ref 150–400)
RBC: 3.36 MIL/uL — ABNORMAL LOW (ref 3.87–5.11)
RDW: 13.3 % (ref 11.5–15.5)
WBC: 8.9 10*3/uL (ref 4.0–10.5)
nRBC: 0 % (ref 0.0–0.2)

## 2020-05-07 LAB — COMPREHENSIVE METABOLIC PANEL
ALT: 29 U/L (ref 0–44)
AST: 24 U/L (ref 15–41)
Albumin: 3.2 g/dL — ABNORMAL LOW (ref 3.5–5.0)
Alkaline Phosphatase: 55 U/L (ref 38–126)
Anion gap: 12 (ref 5–15)
BUN: 19 mg/dL (ref 6–20)
CO2: 28 mmol/L (ref 22–32)
Calcium: 7.9 mg/dL — ABNORMAL LOW (ref 8.9–10.3)
Chloride: 98 mmol/L (ref 98–111)
Creatinine, Ser: 0.9 mg/dL (ref 0.44–1.00)
GFR, Estimated: >60 mL/min (ref >60)
Glucose, Bld: 83 mg/dL (ref 70–99)
Potassium: 3.6 mmol/L (ref 3.5–5.1)
Sodium: 138 mmol/L (ref 135–145)
Total Bilirubin: 0.7 mg/dL (ref 0.3–1.2)
Total Protein: 6.5 g/dL (ref 6.5–8.1)

## 2020-05-07 LAB — GLUCOSE, CAPILLARY
Glucose-Capillary: 98 mg/dL (ref 70–99)
Glucose-Capillary: 77 mg/dL (ref 70–99)
Glucose-Capillary: 70 mg/dL (ref 70–99)
Glucose-Capillary: 95 mg/dL (ref 70–99)

## 2020-05-07 LAB — MAGNESIUM: Magnesium: 2 mg/dL (ref 1.7–2.4)

## 2020-05-07 MED ORDER — CALCIUM CARBONATE ANTACID 500 MG PO CHEW
200.0000 mg | CHEWABLE_TABLET | Freq: Three times a day (TID) | ORAL | Status: DC
  Administered 2020-05-07 – 2020-05-08 (×2): 200 mg via ORAL
  Filled 2020-05-07 (×2): qty 1

## 2020-05-07 MED ORDER — VITAMIN D 25 MCG (1000 UNIT) PO TABS
1000.0000 [IU] | ORAL_TABLET | Freq: Every day | ORAL | Status: DC
  Administered 2020-05-08 – 2020-05-09 (×2): 1000 [IU] via ORAL
  Filled 2020-05-07 (×2): qty 1

## 2020-05-07 MED ORDER — SODIUM CHLORIDE 0.9 % IV SOLN: INTRAVENOUS | Status: DC

## 2020-05-07 NOTE — Progress Notes (Signed)
   Subjective/Chief Complaint: Again had issues last night Feels completely normal this morning with no tingling, cramps   Objective: Vital signs in last 24 hours: Temp:  [98.2 F (36.8 C)-98.7 F (37.1 C)] 98.4 F (36.9 C) (03/23 1022) Pulse Rate:  [78-102] 81 (03/23 1022) Resp:  [14-15] 15 (03/23 1022) BP: (81-131)/(46-105) 111/65 (03/23 1022) SpO2:  [97 %-100 %] 100 % (03/23 1022) Last BM Date: 05/06/20 (per pt)  Intake/Output from previous day: 03/22 0701 - 03/23 0700 In: 2745 [P.O.:720; I.V.:1000; IV Piggyback:1025] Out: -  Intake/Output this shift: Total I/O In: 235 [P.O.:235] Out: -   Exam: Looks well No neurologic symptoms Neck incision healing well  Lab Results:  Recent Labs    05/06/20 0834 05/07/20 0535  WBC 7.9 8.9  HGB 10.6* 9.9*  HCT 32.1* 30.2*  PLT 266 271   BMET Recent Labs    05/06/20 0655 05/06/20 1409 05/06/20 1836 05/07/20 0535  NA 137  --   --  138  K 3.7  --   --  3.6  CL 103  --   --  98  CO2 26  --   --  28  GLUCOSE 81  --   --  83  BUN 14  --   --  19  CREATININE 0.76  --   --  0.90  CALCIUM 6.5*   < > 7.7* 7.9*   < > = values in this interval not displayed.   PT/INR No results for input(s): LABPROT, INR in the last 72 hours. ABG No results for input(s): PHART, HCO3 in the last 72 hours.  Invalid input(s): PCO2, PO2  Studies/Results: No results found.  Anti-infectives: Anti-infectives (From admission, onward)   Start     Dose/Rate Route Frequency Ordered Stop   05/04/20 1000  valACYclovir (VALTREX) tablet 500 mg        500 mg Oral Daily 05/03/20 1455        Assessment/Plan: Post total thyroidectomy hypocalcemia  Ca++ up to 7.9 today on current meds Still may need monitoring for another 24 hours.  Will leave this to the Hospitalist's discretion give her nightly events/symptoms   LOS: 3 days    Coralie Keens 05/07/2020

## 2020-05-07 NOTE — Progress Notes (Signed)
PROGRESS NOTE    Frances Davis  EGB:[id number] DOB: Jul 07, 1980 DOA: 05/03/2020 PCP: Merrilee Seashore, MD   Brief Narrative: Frances Davis a 40 y.o.femalewith medical history significant ofanxiety, acquired hypothyroidism s/p thyroidectomy 2 days ago, GERD. Presenting with numbness X 1 day. She felt muscle tightness and cramping all over which make it difficult to move. She denies any pain; but does reports some tingling in several areas around her body. She became concerned and decided to come to the ED. Of note, she had a thyroidectomy on 05/01/20. She was sent home with levothyroxine and calcium. She reports compliance with these medications. In the ED, pt was found to have a calcium of 6.0. Her K+ was 3.1 and her Mg2+ was 1.6. EDP spoke with general surgery. They recommended medical admission due to concern for electrolyte disturbance. TRH was called for admission.  Assessment & Plan:   Principal Problem:   Hypocalcemia   #1 symptomatic hypocalcemia status post thyroidectomy on 05/01/2020.  Calcium 7.9 from 7.7.  Continue calcium supplementation with Tums 500 mg 4 times a day.  Continue calcitriol at the current dose.  Check BMP q8.  Discussed with nephrology.  #2 hypokalemia and hypomagnesemia replace and recheck labs.  #3 ABLA hemoglobin 9.9 stable  #4 hypothyroidism continue Synthroid  #5 type 2 diabetes blood sugars have been stable ranging from 77-98.  Not on any medications.   #6 depression anxiety continue home meds  #7 mild hypotension change fluids to normal saline check orthostatics    Estimated body mass index is 27.9 kg/m as calculated from the following:   Height as of this encounter: 5' (1.524 m).   Weight as of this encounter: 64.8 kg.  DVT prophylaxis: Lovenox Code Status: Full code Family Communication: Discussed with patient Disposition Plan:  Status is: Inpatient Dispo: The patient is from: Home              Anticipated d/c is to: Home               Patient currently is not medically stable to d/c.   Difficult to place patient not applicable Consultants: General surgery telephone consult with nephrology Procedures: None Antimicrobials: None  Subjective:  Patient resting in bed noted that overnight she had episode of Feeling hot stiff muscles and shaky Later on felt lightheaded with low blood pressure 81/46 Objective: Vitals:   05/07/20 0415 05/07/20 0421 05/07/20 1022 05/07/20 1213  BP: (!) 131/105 (!) 81/46 111/65 102/68  Pulse: 79 78 81   Resp: 14  15   Temp: 98.7 F (37.1 C)  98.4 F (36.9 C)   TempSrc: Oral  Oral   SpO2: 98%  100%   Weight:      Height:        Intake/Output Summary (Last 24 hours) at 05/07/2020 1340 Last data filed at 05/07/2020 0900 Gross per 24 hour  Intake 2500 ml  Output --  Net 2500 ml   Filed Weights   05/03/20 1537  Weight: 64.8 kg    Examination:  General exam: Appears calm and comfortable  Respiratory system: Clear to auscultation. Respiratory effort normal. Cardiovascular system: S1 & S2 heard, RRR. No JVD, murmurs, rubs, gallops or clicks. No pedal edema. Gastrointestinal system: Abdomen is nondistended, soft and nontender. No organomegaly or masses felt. Normal bowel sounds heard. Central nervous system: Alert and oriented. No focal neurological deficits. Extremities: Symmetric 5 x 5 power. Skin: No rashes, lesions or ulcers Psychiatry: Judgement and insight appear normal. Mood & affect  appropriate.     Data Reviewed: I have personally reviewed following labs and imaging studies  CBC: Recent Labs  Lab 05/03/20 1014 05/03/20 1519 05/04/20 0021 05/05/20 0829 05/06/20 0834 05/07/20 0535  WBC 10.6* 9.4 9.2 6.8 7.9 8.9  NEUTROABS 7.0  --   --  3.6 4.1 4.4  HGB 11.0* 10.3* 9.4* 9.6* 10.6* 9.9*  HCT 33.6* 31.3* 28.6* 29.2* 32.1* 30.2*  MCV 90.6 90.7 91.4 90.7 90.7 89.9  PLT 254 227 223 217 266 846   Basic Metabolic Panel: Recent Labs  Lab 05/05/20 0829  05/05/20 1258 05/05/20 2045 05/05/20 2047 05/06/20 0655 05/06/20 1409 05/06/20 1836 05/07/20 0535  NA 139 140  --  141 137  --   --  138  K 3.7 3.9  --  5.0 3.7  --   --  3.6  CL 103 103  --  100 103  --   --  98  CO2 26 28  --  27 26  --   --  28  GLUCOSE 88 85  --  92 81  --   --  83  BUN 11 11  --  13 14  --   --  19  CREATININE 0.72 0.69  --  0.76 0.76  --   --  0.90  CALCIUM 5.8* 6.7*  6.5   < > 6.6* 6.5* 6.8* 7.7* 7.9*  MG 1.8 1.7  --  1.7  --  1.5* 1.7 2.0  PHOS  --  4.1  --   --   --   --   --   --    < > = values in this interval not displayed.   GFR: Estimated Creatinine Clearance: 71.9 mL/min (by C-G formula based on SCr of 0.9 mg/dL). Liver Function Tests: Recent Labs  Lab 05/04/20 0021 05/05/20 1258 05/05/20 2047 05/06/20 0655 05/07/20 0535  AST 22 47* 47* 27 24  ALT 15 41 41 33 29  ALKPHOS 30* 57 53 50 55  BILITOT 0.6 0.6 0.6 0.5 0.7  PROT 5.5* 6.8 6.7 5.9* 6.5  ALBUMIN 2.7* 3.3* 3.3* 2.9* 3.2*   No results for input(s): LIPASE, AMYLASE in the last 168 hours. No results for input(s): AMMONIA in the last 168 hours. Coagulation Profile: No results for input(s): INR, PROTIME in the last 168 hours. Cardiac Enzymes: Recent Labs  Lab 05/03/20 1014  CKTOTAL 199   BNP (last 3 results) No results for input(s): PROBNP in the last 8760 hours. HbA1C: No results for input(s): HGBA1C in the last 72 hours. CBG: Recent Labs  Lab 05/06/20 1148 05/06/20 1636 05/06/20 2008 05/07/20 0821 05/07/20 1229  GLUCAP 85 93 81 98 77   Lipid Profile: No results for input(s): CHOL, HDL, LDLCALC, TRIG, CHOLHDL, LDLDIRECT in the last 72 hours. Thyroid Function Tests: Recent Labs    05/05/20 2047  TSH 0.128*   Anemia Panel: No results for input(s): VITAMINB12, FOLATE, FERRITIN, TIBC, IRON, RETICCTPCT in the last 72 hours. Sepsis Labs: Recent Labs  Lab 05/05/20 1314  LATICACIDVEN 0.9    Recent Results (from the past 240 hour(s))  SARS CORONAVIRUS 2 (TAT 6-24  HRS) Nasopharyngeal Nasopharyngeal Swab     Status: None   Collection Time: 04/28/20 10:45 AM   Specimen: Nasopharyngeal Swab  Result Value Ref Range Status   SARS Coronavirus 2 NEGATIVE NEGATIVE Final    Comment: (NOTE) SARS-CoV-2 target nucleic acids are NOT DETECTED.  The SARS-CoV-2 RNA is generally detectable in upper and lower respiratory specimens  during the acute phase of infection. Negative results do not preclude SARS-CoV-2 infection, do not rule out co-infections with other pathogens, and should not be used as the sole basis for treatment or other patient management decisions. Negative results must be combined with clinical observations, patient history, and epidemiological information. The expected result is Negative.  Fact Sheet for Patients: SugarRoll.be  Fact Sheet for Healthcare Providers: https://www.woods-.com/  This test is not yet approved or cleared by the [place] FDA and  has been authorized for detection and/or diagnosis of SARS-CoV-2 by FDA under an Emergency Use Authorization (EUA). This EUA will remain  in effect (meaning this test can be used) for the duration of the COVID-19 declaration under Se ction 564(b)(1) of the Act, 21 U.S.C. section 360bbb-3(b)(1), unless the authorization is terminated or revoked sooner.  Performed at [facility], [place] [address], [place], [place] [address]   Resp Panel by RT-PCR (Flu A&B, Covid) Nasopharyngeal Swab     Status: None   Collection Time: 05/03/20 11:49 AM   Specimen: Nasopharyngeal Swab; Nasopharyngeal(NP) swabs in vial transport medium  Result Value Ref Range Status   SARS Coronavirus 2 by RT PCR NEGATIVE NEGATIVE Final    Comment: (NOTE) SARS-CoV-2 target nucleic acids are NOT DETECTED.  The SARS-CoV-2 RNA is generally detectable in upper respiratory specimens during the acute phase of infection. The lowest concentration of SARS-CoV-2 viral copies  this assay can detect is 138 copies/mL. A negative result does not preclude SARS-Cov-2 infection and should not be used as the sole basis for treatment or other patient management decisions. A negative result may occur with  improper specimen collection/handling, submission of specimen other than nasopharyngeal swab, presence of viral mutation(s) within the areas targeted by this assay, and inadequate number of viral copies(<138 copies/mL). A negative result must be combined with clinical observations, patient history, and epidemiological information. The expected result is Negative.  Fact Sheet for Patients:  EntrepreneurPulse.com.au  Fact Sheet for Healthcare Providers:  IncredibleEmployment.be  This test is no t yet approved or cleared by the [place] FDA and  has been authorized for detection and/or diagnosis of SARS-CoV-2 by FDA under an Emergency Use Authorization (EUA). This EUA will remain  in effect (meaning this test can be used) for the duration of the COVID-19 declaration under Section 564(b)(1) of the Act, 21 U.S.C.section 360bbb-3(b)(1), unless the authorization is terminated  or revoked sooner.       Influenza A by PCR NEGATIVE NEGATIVE Final   Influenza B by PCR NEGATIVE NEGATIVE Final    Comment: (NOTE) The Xpert Xpress SARS-CoV-2/FLU/RSV plus assay is intended as an aid in the diagnosis of influenza from Nasopharyngeal swab specimens and should not be used as a sole basis for treatment. Nasal washings and aspirates are unacceptable for Xpert Xpress SARS-CoV-2/FLU/RSV testing.  Fact Sheet for Patients: EntrepreneurPulse.com.au  Fact Sheet for Healthcare Providers: IncredibleEmployment.be  This test is not yet approved or cleared by the [place] FDA and has been authorized for detection and/or diagnosis of SARS-CoV-2 by FDA under an Emergency Use Authorization (EUA). This EUA  will remain in effect (meaning this test can be used) for the duration of the COVID-19 declaration under Section 564(b)(1) of the Act, 21 U.S.C. section 360bbb-3(b)(1), unless the authorization is terminated or revoked.  Performed at [facility], [place] [address] [place], [place] [address]   Culture, blood (routine x 2)     Status: None (Preliminary result)   Collection Time: 05/05/20  12:58 PM   Specimen: BLOOD  Result Value Ref Range Status   Specimen Description   Final    BLOOD LEFT ANTECUBITAL Performed at [facility] [address], [place], [place] [address]    Special Requests   Final    BOTTLES DRAWN AEROBIC ONLY Blood Culture adequate volume Performed at [facility] [address] [place], [place] [address]    Culture   Final    NO GROWTH 2 DAYS Performed at [facility] [address], [place], [place] [address]    Report Status PENDING  Incomplete  Culture, blood (routine x 2)     Status: None (Preliminary result)   Collection Time: 05/05/20 12:58 PM   Specimen: BLOOD  Result Value Ref Range Status   Specimen Description   Final    BLOOD RIGHT ANTECUBITAL Performed at [facility] [address] [place], [place] [address]    Special Requests   Final    BOTTLES DRAWN AEROBIC ONLY Blood Culture adequate volume Performed at [facility] [address], [place], [place] [address]    Culture   Final    NO GROWTH 2 DAYS Performed at [facility] [address] [place], [place] [address]    Report Status PENDING  Incomplete         Radiology Studies: No results found.      Scheduled Meds: . calcitRIOL  0.5 mcg Oral TID  . calcium-vitamin D  2 tablet Oral TID  . cholecalciferol  1,000 Units Oral TID  . divalproex  500 mg Oral Daily  . enoxaparin (LOVENOX) injection  40 mg Subcutaneous Q24H  . estradiol  1 mg Oral Daily  . insulin  aspart  0-6 Units Subcutaneous TID WC  . levothyroxine  100 mcg Oral Daily  . magnesium oxide  400 mg Oral BID  . multivitamin with minerals  1 tablet Oral Daily  . polyethylene glycol  17 g Oral Daily  . QUEtiapine  100 mg Oral QHS  . senna-docusate  1 tablet Oral BID  . sertraline  100 mg Oral QHS  . valACYclovir  500 mg Oral Daily   Continuous Infusions: . sodium chloride 150 mL/hr at 05/07/20 1233     LOS: 3 days    Georgette Shell, MD  05/07/2020, 1:40 PM

## 2020-05-08 LAB — CBC WITH DIFFERENTIAL/PLATELET
Abs Immature Granulocytes: 0.02 10*3/uL (ref 0.00–0.07)
Abs Immature Granulocytes: 0.03 10*3/uL (ref 0.00–0.07)
Basophils Absolute: 0.1 10*3/uL (ref 0.0–0.1)
Basophils Absolute: 0 10*3/uL (ref 0.0–0.1)
Basophils Relative: 1 %
Basophils Relative: 1 %
Eosinophils Absolute: 0.5 10*3/uL (ref 0.0–0.5)
Eosinophils Absolute: 0.4 10*3/uL (ref 0.0–0.5)
Eosinophils Relative: 5 %
Eosinophils Relative: 5 %
HCT: 28 % — ABNORMAL LOW (ref 36.0–46.0)
HCT: 32.2 % — ABNORMAL LOW (ref 36.0–46.0)
Hemoglobin: 9 g/dL — ABNORMAL LOW (ref 12.0–15.0)
Hemoglobin: 10.6 g/dL — ABNORMAL LOW (ref 12.0–15.0)
Immature Granulocytes: 0 %
Immature Granulocytes: 0 %
Lymphocytes Relative: 37 %
Lymphocytes Relative: 26 %
Lymphs Abs: 3.7 10*3/uL (ref 0.7–4.0)
Lymphs Abs: 2.1 10*3/uL (ref 0.7–4.0)
MCH: 29.2 pg (ref 26.0–34.0)
MCH: 29.9 pg (ref 26.0–34.0)
MCHC: 32.1 g/dL (ref 30.0–36.0)
MCHC: 32.9 g/dL (ref 30.0–36.0)
MCV: 90.9 fL (ref 80.0–100.0)
MCV: 90.7 fL (ref 80.0–100.0)
Monocytes Absolute: 1 10*3/uL (ref 0.1–1.0)
Monocytes Absolute: 0.7 10*3/uL (ref 0.1–1.0)
Monocytes Relative: 10 %
Monocytes Relative: 9 %
Neutro Abs: 4.8 10*3/uL (ref 1.7–7.7)
Neutro Abs: 4.7 10*3/uL (ref 1.7–7.7)
Neutrophils Relative %: 47 %
Neutrophils Relative %: 59 %
Platelets: 254 10*3/uL (ref 150–400)
Platelets: 275 10*3/uL (ref 150–400)
RBC: 3.08 MIL/uL — ABNORMAL LOW (ref 3.87–5.11)
RBC: 3.55 MIL/uL — ABNORMAL LOW (ref 3.87–5.11)
RDW: 13.4 % (ref 11.5–15.5)
RDW: 13.3 % (ref 11.5–15.5)
WBC: 10.2 10*3/uL (ref 4.0–10.5)
WBC: 8 10*3/uL (ref 4.0–10.5)
nRBC: 0 % (ref 0.0–0.2)
nRBC: 0 % (ref 0.0–0.2)

## 2020-05-08 LAB — COMPREHENSIVE METABOLIC PANEL
ALT: 26 U/L (ref 0–44)
ALT: 30 U/L (ref 0–44)
AST: 20 U/L (ref 15–41)
AST: 25 U/L (ref 15–41)
Albumin: 3 g/dL — ABNORMAL LOW (ref 3.5–5.0)
Albumin: 3.5 g/dL (ref 3.5–5.0)
Alkaline Phosphatase: 43 U/L (ref 38–126)
Alkaline Phosphatase: 51 U/L (ref 38–126)
Anion gap: 11 (ref 5–15)
Anion gap: 11 (ref 5–15)
BUN: 17 mg/dL (ref 6–20)
BUN: 12 mg/dL (ref 6–20)
CO2: 26 mmol/L (ref 22–32)
CO2: 29 mmol/L (ref 22–32)
Calcium: 7.3 mg/dL — ABNORMAL LOW (ref 8.9–10.3)
Calcium: 9.1 mg/dL (ref 8.9–10.3)
Chloride: 100 mmol/L (ref 98–111)
Chloride: 103 mmol/L (ref 98–111)
Creatinine, Ser: 0.64 mg/dL (ref 0.44–1.00)
Creatinine, Ser: 0.66 mg/dL (ref 0.44–1.00)
GFR, Estimated: >60 mL/min (ref >60)
GFR, Estimated: >60 mL/min (ref >60)
Glucose, Bld: 87 mg/dL (ref 70–99)
Glucose, Bld: 82 mg/dL (ref 70–99)
Potassium: 3.8 mmol/L (ref 3.5–5.1)
Potassium: 3.8 mmol/L (ref 3.5–5.1)
Sodium: 137 mmol/L (ref 135–145)
Sodium: 143 mmol/L (ref 135–145)
Total Bilirubin: 0.6 mg/dL (ref 0.3–1.2)
Total Bilirubin: 0.5 mg/dL (ref 0.3–1.2)
Total Protein: 5.9 g/dL — ABNORMAL LOW (ref 6.5–8.1)
Total Protein: 7.1 g/dL (ref 6.5–8.1)

## 2020-05-08 LAB — GLUCOSE, CAPILLARY
Glucose-Capillary: 79 mg/dL (ref 70–99)
Glucose-Capillary: 82 mg/dL (ref 70–99)
Glucose-Capillary: 93 mg/dL (ref 70–99)
Glucose-Capillary: 76 mg/dL (ref 70–99)

## 2020-05-08 LAB — MAGNESIUM: Magnesium: 1.7 mg/dL (ref 1.7–2.4)

## 2020-05-08 MED ORDER — BISACODYL 5 MG PO TBEC
10.0000 mg | DELAYED_RELEASE_TABLET | Freq: Every day | ORAL | Status: DC
  Administered 2020-05-08 – 2020-05-09 (×2): 10 mg via ORAL
  Filled 2020-05-08 (×2): qty 2

## 2020-05-08 MED ORDER — CALCIUM GLUCONATE-NACL 2-0.675 GM/100ML-% IV SOLN
2.0000 g | Freq: Three times a day (TID) | INTRAVENOUS | Status: AC
  Administered 2020-05-08 (×3): 2000 mg via INTRAVENOUS
  Filled 2020-05-08 (×3): qty 100

## 2020-05-08 MED ORDER — CALCIUM CARBONATE ANTACID 500 MG PO CHEW
400.0000 mg | CHEWABLE_TABLET | Freq: Three times a day (TID) | ORAL | Status: DC
  Administered 2020-05-08 – 2020-05-09 (×3): 400 mg via ORAL
  Filled 2020-05-08 (×3): qty 2

## 2020-05-08 MED ORDER — ALUM & MAG HYDROXIDE-SIMETH 200-200-20 MG/5ML PO SUSP
15.0000 mL | Freq: Four times a day (QID) | ORAL | Status: DC | PRN
  Administered 2020-05-08: 15 mL via ORAL
  Filled 2020-05-08: qty 30

## 2020-05-08 NOTE — Progress Notes (Signed)
Patient stating she feels tingling sensation again like the previous nights when her calcium dropped. Chotiner, MD placed STAT BMP. Monitoring continues.

## 2020-05-08 NOTE — Progress Notes (Signed)
PROGRESS NOTE    Frances Davis  ATF:[id number] DOB: 1980-06-20 DOA: 05/03/2020 PCP: Merrilee Seashore, MD   Brief Narrative: [name] a 40 y.o.femalewith medical history significant ofanxiety, acquired hypothyroidism s/p thyroidectomy 2 days ago, GERD. Presenting with numbness X 1 day. She felt muscle tightness and cramping all over which make it difficult to move. She denies any pain; but does reports some tingling in several areas around her body. She became concerned and decided to come to the ED. Of note, she had a thyroidectomy on 05/01/20. She was sent home with levothyroxine and calcium. She reports compliance with these medications. In the ED, pt was found to have a calcium of 6.0. Her K+ was 3.1 and her Mg2+ was 1.6. EDP spoke with general surgery. They recommended medical admission due to concern for electrolyte disturbance. TRH was called for admission.  Assessment & Plan:   Principal Problem:   Hypocalcemia   #1 symptomatic hypocalcemia status post thyroidectomy on 05/01/2020.  Calcium 7.3 from 7.9 from 7.7.  Increase Tums to 4 times a day.  Continue calcitriol.  BMP every 8.  Calcium needs to be above 8 prior to discharge.  Discussed with nephrology.   #2 hypokalemia and hypomagnesemia-potassium 3.8 magnesium 2.0.   #3 ABLA hemoglobin 9.0 from 9.9 partly from hemodilution with IV fluids.   #4 hypothyroidism continue Synthroid  #5 type 2 diabetes blood sugars have been stable ranging from 77-98.  Not on any medications.   #6 depression anxiety continue home meds  #7 mild hypotension -orthostatics pending.  Will consider midodrine if symptomatic.   Estimated body mass index is 27.9 kg/m as calculated from the following:   Height as of this encounter: 5' (1.524 m).   Weight as of this encounter: 64.8 kg.  DVT prophylaxis: Lovenox Code Status: Full code Family Communication: Discussed with patient Disposition Plan:  Status is: Inpatient Dispo: The  patient is from: Home              Anticipated d/c is to: Home              Patient currently is not medically stable to d/c.   Difficult to place patient not applicable Consultants: General surgery telephone consult with nephrology Procedures: None Antimicrobials: None  Subjective:  Patient resting in bed Had an episode of tingling and numbness again last night but it was brief compared to the previous night  objective: Vitals:   05/07/20 1720 05/07/20 2052 05/07/20 2359 05/08/20 0527  BP: (!) 103/38 (!) 107/53 (!) 103/56 (!) 91/44  Pulse: 82 88 85 75  Resp: 14 16  14   Temp: 98.2 F (36.8 C) 98.6 F (37 C) 98.4 F (36.9 C) 98.1 F (36.7 C)  TempSrc: Oral Oral Oral Oral  SpO2: 100% 100% 95% 97%  Weight:      Height:        Intake/Output Summary (Last 24 hours) at 05/08/2020 0954 Last data filed at 05/08/2020 0316 Gross per 24 hour  Intake 2381.53 ml  Output --  Net 2381.53 ml   Filed Weights   05/03/20 1537  Weight: 64.8 kg    Examination:  General exam: Appears calm and comfortable  Respiratory system: Clear to auscultation. Respiratory effort normal. Cardiovascular system: S1 & S2 heard, RRR. No JVD, murmurs, rubs, gallops or clicks. No pedal edema. Gastrointestinal system: Abdomen is nondistended, soft and nontender. No organomegaly or masses felt. Normal bowel sounds heard. Central nervous system: Alert and oriented. No focal neurological deficits. Extremities: Symmetric  5 x 5 power. Skin: No rashes, lesions or ulcers Psychiatry: Judgement and insight appear normal. Mood & affect appropriate.     Data Reviewed: I have personally reviewed following labs and imaging studies  CBC: Recent Labs  Lab 05/03/20 1014 05/03/20 1519 05/04/20 0021 05/05/20 0829 05/06/20 0834 05/07/20 0535 05/08/20 0038  WBC 10.6*   < > 9.2 6.8 7.9 8.9 10.2  NEUTROABS 7.0  --   --  3.6 4.1 4.4 4.8  HGB 11.0*   < > 9.4* 9.6* 10.6* 9.9* 9.0*  HCT 33.6*   < > 28.6* 29.2* 32.1*  30.2* 28.0*  MCV 90.6   < > 91.4 90.7 90.7 89.9 90.9  PLT 254   < > 223 217 266 271 254   < > = values in this interval not displayed.   Basic Metabolic Panel: Recent Labs  Lab 05/05/20 1258 05/05/20 2045 05/05/20 2047 05/06/20 0655 05/06/20 1409 05/06/20 1836 05/07/20 0535 05/08/20 0038  NA 140  --  141 137  --   --  138 137  K 3.9  --  5.0 3.7  --   --  3.6 3.8  CL 103  --  100 103  --   --  98 100  CO2 28  --  27 26  --   --  28 26  GLUCOSE 85  --  92 81  --   --  83 87  BUN 11  --  13 14  --   --  19 17  CREATININE 0.69  --  0.76 0.76  --   --  0.90 0.64  CALCIUM 6.7*  6.5   < > 6.6* 6.5* 6.8* 7.7* 7.9* 7.3*  MG 1.7  --  1.7  --  1.5* 1.7 2.0  --   PHOS 4.1  --   --   --   --   --   --   --    < > = values in this interval not displayed.   GFR: Estimated Creatinine Clearance: 80.9 mL/min (by C-G formula based on SCr of 0.64 mg/dL). Liver Function Tests: Recent Labs  Lab 05/05/20 1258 05/05/20 2047 05/06/20 0655 05/07/20 0535 05/08/20 0038  AST 47* 47* 27 24 20   ALT 41 41 33 29 26  ALKPHOS 57 53 50 55 43  BILITOT 0.6 0.6 0.5 0.7 0.6  PROT 6.8 6.7 5.9* 6.5 5.9*  ALBUMIN 3.3* 3.3* 2.9* 3.2* 3.0*   No results for input(s): LIPASE, AMYLASE in the last 168 hours. No results for input(s): AMMONIA in the last 168 hours. Coagulation Profile: No results for input(s): INR, PROTIME in the last 168 hours. Cardiac Enzymes: Recent Labs  Lab 05/03/20 1014  CKTOTAL 199   BNP (last 3 results) No results for input(s): PROBNP in the last 8760 hours. HbA1C: No results for input(s): HGBA1C in the last 72 hours. CBG: Recent Labs  Lab 05/07/20 0821 05/07/20 1229 05/07/20 1717 05/07/20 2053 05/08/20 0739  GLUCAP 98 77 70 95 79   Lipid Profile: No results for input(s): CHOL, HDL, LDLCALC, TRIG, CHOLHDL, LDLDIRECT in the last 72 hours. Thyroid Function Tests: Recent Labs    05/05/20 2047  TSH 0.128*   Anemia Panel: No results for input(s): VITAMINB12, FOLATE,  FERRITIN, TIBC, IRON, RETICCTPCT in the last 72 hours. Sepsis Labs: Recent Labs  Lab 05/05/20 1314  LATICACIDVEN 0.9    Recent Results (from the past 240 hour(s))  SARS CORONAVIRUS 2 (TAT 6-24 HRS) Nasopharyngeal Nasopharyngeal Swab  Status: None   Collection Time: 04/28/20 10:45 AM   Specimen: Nasopharyngeal Swab  Result Value Ref Range Status   SARS Coronavirus 2 NEGATIVE NEGATIVE Final    Comment: (NOTE) SARS-CoV-2 target nucleic acids are NOT DETECTED.  The SARS-CoV-2 RNA is generally detectable in upper and lower respiratory specimens during the acute phase of infection. Negative results do not preclude SARS-CoV-2 infection, do not rule out co-infections with other pathogens, and should not be used as the sole basis for treatment or other patient management decisions. Negative results must be combined with clinical observations, patient history, and epidemiological information. The expected result is Negative.  Fact Sheet for Patients: SugarRoll.be  Fact Sheet for Healthcare Providers: https://www.woods-.com/  This test is not yet approved or cleared by the [place] FDA and  has been authorized for detection and/or diagnosis of SARS-CoV-2 by FDA under an Emergency Use Authorization (EUA). This EUA will remain  in effect (meaning this test can be used) for the duration of the COVID-19 declaration under Se ction 564(b)(1) of the Act, 21 U.S.C. section 360bbb-3(b)(1), unless the authorization is terminated or revoked sooner.  Performed at [facility], [place] [address], [place],  [address]   Resp Panel by RT-PCR (Flu A&B, Covid) Nasopharyngeal Swab     Status: None   Collection Time: 05/03/20 11:49 AM   Specimen: Nasopharyngeal Swab; Nasopharyngeal(NP) swabs in vial transport medium  Result Value Ref Range Status   SARS Coronavirus 2 by RT PCR NEGATIVE NEGATIVE Final    Comment: (NOTE) SARS-CoV-2  target nucleic acids are NOT DETECTED.  The SARS-CoV-2 RNA is generally detectable in upper respiratory specimens during the acute phase of infection. The lowest concentration of SARS-CoV-2 viral copies this assay can detect is 138 copies/mL. A negative result does not preclude SARS-Cov-2 infection and should not be used as the sole basis for treatment or other patient management decisions. A negative result may occur with  improper specimen collection/handling, submission of specimen other than nasopharyngeal swab, presence of viral mutation(s) within the areas targeted by this assay, and inadequate number of viral copies(<138 copies/mL). A negative result must be combined with clinical observations, patient history, and epidemiological information. The expected result is Negative.  Fact Sheet for Patients:  EntrepreneurPulse.com.au  Fact Sheet for Healthcare Providers:  IncredibleEmployment.be  This test is no t yet approved or cleared by the [place] FDA and  has been authorized for detection and/or diagnosis of SARS-CoV-2 by FDA under an Emergency Use Authorization (EUA). This EUA will remain  in effect (meaning this test can be used) for the duration of the COVID-19 declaration under Section 564(b)(1) of the Act, 21 U.S.C.section 360bbb-3(b)(1), unless the authorization is terminated  or revoked sooner.       Influenza A by PCR NEGATIVE NEGATIVE Final   Influenza B by PCR NEGATIVE NEGATIVE Final    Comment: (NOTE) The Xpert Xpress SARS-CoV-2/FLU/RSV plus assay is intended as an aid in the diagnosis of influenza from Nasopharyngeal swab specimens and should not be used as a sole basis for treatment. Nasal washings and aspirates are unacceptable for Xpert Xpress SARS-CoV-2/FLU/RSV testing.  Fact Sheet for Patients: EntrepreneurPulse.com.au  Fact Sheet for Healthcare  Providers: IncredibleEmployment.be  This test is not yet approved or cleared by the [place] FDA and has been authorized for detection and/or diagnosis of SARS-CoV-2 by FDA under an Emergency Use Authorization (EUA). This EUA will remain in effect (meaning this test can be used) for the duration of the  COVID-19 declaration under Section 564(b)(1) of the Act, 21 U.S.C. section 360bbb-3(b)(1), unless the authorization is terminated or revoked.  Performed at [facility], [place] [address], [place], [place] [address]   Culture, blood (routine x 2)     Status: None (Preliminary result)   Collection Time: 05/05/20 12:58 PM   Specimen: BLOOD  Result Value Ref Range Status   Specimen Description   Final    BLOOD LEFT ANTECUBITAL Performed at [facility] [address] [place], [place] [address]    Special Requests   Final    BOTTLES DRAWN AEROBIC ONLY Blood Culture adequate volume Performed at [facility] [address], [place], [place] [address]    Culture   Final    NO GROWTH 3 DAYS Performed at [facility], [place] [address], [place], [place] [address]    Report Status PENDING  Incomplete  Culture, blood (routine x 2)     Status: None (Preliminary result)   Collection Time: 05/05/20 12:58 PM   Specimen: BLOOD  Result Value Ref Range Status   Specimen Description   Final    BLOOD RIGHT ANTECUBITAL Performed at [facility] [address], [place], [place] [address]    Special Requests   Final    BOTTLES DRAWN AEROBIC ONLY Blood Culture adequate volume Performed at [facility] [address] [place], [place] [address]    Culture   Final    NO GROWTH 3 DAYS Performed at [facility], [place] [address], [place],  [address]    Report Status PENDING  Incomplete         Radiology Studies: No results found.      Scheduled Meds: .  bisacodyl  10 mg Oral Daily  . calcitRIOL  0.5 mcg Oral TID  . calcium carbonate  400 mg of elemental calcium Oral TID WC  . cholecalciferol  1,000 Units Oral Daily  . divalproex  500 mg Oral Daily  . enoxaparin (LOVENOX) injection  40 mg Subcutaneous Q24H  . estradiol  1 mg Oral Daily  . insulin aspart  0-6 Units Subcutaneous TID WC  . levothyroxine  100 mcg Oral Daily  . magnesium oxide  400 mg Oral BID  . multivitamin with minerals  1 tablet Oral Daily  . polyethylene glycol  17 g Oral Daily  . QUEtiapine  100 mg Oral QHS  . senna-docusate  1 tablet Oral BID  . sertraline  100 mg Oral QHS  . valACYclovir  500 mg Oral Daily   Continuous Infusions: . sodium chloride 150 mL/hr at 05/08/20 0514  . calcium gluconate 2,000 mg (05/08/20 0952)     LOS: 4 days    Georgette Shell, MD  05/08/2020, 9:54 AM

## 2020-05-08 NOTE — Progress Notes (Signed)
Patient's calcium dropped to 7.3 and MD ordered calcium IVPB. After infusion was complete, patient stated that she no longer was having any symptoms.

## 2020-05-08 NOTE — Progress Notes (Signed)
   Subjective/Chief Complaint: She still had symptoms of hypocalcemia again last night Feels completely normal again today   Objective: Vital signs in last 24 hours: Temp:  [98.1 F (36.7 C)-98.8 F (37.1 C)] 98.8 F (37.1 C) (03/24 1229) Pulse Rate:  [75-95] 78 (03/24 1232) Resp:  [14-16] 15 (03/24 1232) BP: (91-110)/(38-67) 107/67 (03/24 1232) SpO2:  [95 %-100 %] 100 % (03/24 1232) Last BM Date: 05/07/20  Intake/Output from previous day: 03/23 0701 - 03/24 0700 In: 2616.5 [P.O.:475; I.V.:2041.5; IV Piggyback:100] Out: -  Intake/Output this shift: Total I/O In: 573 [P.O.:573] Out: -   Exam: No new changes  Lab Results:  Recent Labs    05/08/20 0038 05/08/20 1050  WBC 10.2 8.0  HGB 9.0* 10.6*  HCT 28.0* 32.2*  PLT 254 275   BMET Recent Labs    05/08/20 0038 05/08/20 1050  NA 137 143  K 3.8 3.8  CL 100 103  CO2 26 29  GLUCOSE 87 82  BUN 17 12  CREATININE 0.64 0.66  CALCIUM 7.3* 9.1   PT/INR No results for input(s): LABPROT, INR in the last 72 hours. ABG No results for input(s): PHART, HCO3 in the last 72 hours.  Invalid input(s): PCO2, PO2  Studies/Results: No results found.  Anti-infectives: Anti-infectives (From admission, onward)   Start     Dose/Rate Route Frequency Ordered Stop   05/04/20 1000  valACYclovir (VALTREX) tablet 500 mg        500 mg Oral Daily 05/03/20 1455        Assessment/Plan: Post op hypocalcemia  Last calcium was 9.1 Hopefully will regulate and can go home tomorrow   LOS: 4 days    Coralie Keens 05/08/2020

## 2020-05-08 NOTE — Progress Notes (Signed)
Rapid RN notified. Labs drawn and awaiting results.

## 2020-05-09 LAB — COMPREHENSIVE METABOLIC PANEL
ALT: 26 U/L (ref 0–44)
AST: 21 U/L (ref 15–41)
Albumin: 3.1 g/dL — ABNORMAL LOW (ref 3.5–5.0)
Alkaline Phosphatase: 43 U/L (ref 38–126)
Anion gap: 9 (ref 5–15)
BUN: 12 mg/dL (ref 6–20)
CO2: 28 mmol/L (ref 22–32)
Calcium: 8.3 mg/dL — ABNORMAL LOW (ref 8.9–10.3)
Chloride: 102 mmol/L (ref 98–111)
Creatinine, Ser: 0.88 mg/dL (ref 0.44–1.00)
GFR, Estimated: >60 mL/min (ref >60)
Glucose, Bld: 87 mg/dL (ref 70–99)
Potassium: 3.6 mmol/L (ref 3.5–5.1)
Sodium: 139 mmol/L (ref 135–145)
Total Bilirubin: 0.5 mg/dL (ref 0.3–1.2)
Total Protein: 6.4 g/dL — ABNORMAL LOW (ref 6.5–8.1)

## 2020-05-09 LAB — CBC WITH DIFFERENTIAL/PLATELET
Abs Immature Granulocytes: 0.03 10*3/uL (ref 0.00–0.07)
Basophils Absolute: 0.1 10*3/uL (ref 0.0–0.1)
Basophils Relative: 1 %
Eosinophils Absolute: 0.4 10*3/uL (ref 0.0–0.5)
Eosinophils Relative: 5 %
HCT: 29.2 % — ABNORMAL LOW (ref 36.0–46.0)
Hemoglobin: 9.5 g/dL — ABNORMAL LOW (ref 12.0–15.0)
Immature Granulocytes: 0 %
Lymphocytes Relative: 23 %
Lymphs Abs: 2.3 10*3/uL (ref 0.7–4.0)
MCH: 29.8 pg (ref 26.0–34.0)
MCHC: 32.5 g/dL (ref 30.0–36.0)
MCV: 91.5 fL (ref 80.0–100.0)
Monocytes Absolute: 1.1 10*3/uL — ABNORMAL HIGH (ref 0.1–1.0)
Monocytes Relative: 11 %
Neutro Abs: 5.8 10*3/uL (ref 1.7–7.7)
Neutrophils Relative %: 60 %
Platelets: 262 10*3/uL (ref 150–400)
RBC: 3.19 MIL/uL — ABNORMAL LOW (ref 3.87–5.11)
RDW: 13 % (ref 11.5–15.5)
WBC: 9.7 10*3/uL (ref 4.0–10.5)
nRBC: 0 % (ref 0.0–0.2)

## 2020-05-09 LAB — GLUCOSE, CAPILLARY: Glucose-Capillary: 78 mg/dL (ref 70–99)

## 2020-05-09 LAB — CALCIUM, IONIZED: Calcium, Ionized, Serum: 4.2 mg/dL — ABNORMAL LOW (ref 4.5–5.6)

## 2020-05-09 MED ORDER — CALCITRIOL 0.5 MCG PO CAPS
0.5000 ug | ORAL_CAPSULE | Freq: Two times a day (BID) | ORAL | Status: DC
  Administered 2020-05-09: 0.5 ug via ORAL

## 2020-05-09 MED ORDER — MAGNESIUM OXIDE 400 (241.3 MG) MG PO TABS: 400.0000 mg | ORAL_TABLET | Freq: Two times a day (BID) | ORAL | Status: AC

## 2020-05-09 MED ORDER — VITAMIN D3 25 MCG PO TABS: 1000.0000 [IU] | ORAL_TABLET | Freq: Every day | ORAL | Status: AC

## 2020-05-09 MED ORDER — CALCIUM CARBONATE ANTACID 500 MG PO CHEW: 2.0000 | CHEWABLE_TABLET | Freq: Three times a day (TID) | ORAL | 3 refills | Status: AC

## 2020-05-09 MED ORDER — CALCITRIOL 0.5 MCG PO CAPS: 0.5000 ug | ORAL_CAPSULE | Freq: Two times a day (BID) | ORAL | 2 refills | Status: AC

## 2020-05-09 NOTE — Discharge Summary (Signed)
Physician Discharge Summary  Frances Davis XVQ:[id number] DOB: 14-Aug-1980 DOA: 05/03/2020  PCP: Merrilee Seashore, MD  Admit date: 05/03/2020 Discharge date: 05/09/2020  Admitted From: home Disposition: home Recommendations for Outpatient Follow-up:  1. Follow up with PCP in 1-2 weeks 2. Please obtain BMP/CBC in one week 3. Please follow up with Endocrinologist  Home Health: None Equipment/Devices: None  Discharge Condition stable CODE STATUS: Full code  diet recommendation: Cardiac Brief/Interim Summary:Frances Macfieis a 40 y.o.femalewith medical history significant ofanxiety, acquired hypothyroidism s/p thyroidectomy 2 days ago, GERD. Presenting with numbness X 1 day. She felt muscle tightness and cramping all over which make it difficult to move. She denies any pain; but does reports some tingling in several areas around her body. She became concerned and decided to come to the ED. Of note, she had a thyroidectomy on 05/01/20. She was sent home with levothyroxine and calcium. She reports compliance with these medications. In the ED, pt was found to have a calcium of 6.0. Her K+ was 3.1 and her Mg2+ was 1.6. EDP spoke with general surgery. They recommended medical admission due to concern for electrolyte disturbance. TRH was called for admission.   Discharge Diagnoses:  Principal Problem:   Hypocalcemia  #1 symptomatic hypocalcemia status post thyroidectomy on 05/01/2020.  She was treated with Rocaltrol vitamin D and Tums.  She was also given calcium gluconate IV few different times when she had symptoms of  feeling hot anxious stiff muscles and shaky with tingling all over the body.  She was on seizure precautions did not have any seizure activity during the hospital stay.  Her care was discussed with nephrologist multiple times over the phone.  Dr. Ninfa Linden followed the patient during the hospital stay.  #2 hypokalemia and hypomagnesemia-resolved.  #3 ABLA hemoglobin   remained stable.  #4 hypothyroidism continue Synthroid  #5 type 2 diabetes blood sugars have been stable ranging from 77-98.  Not on any medications.   #6 depression anxiety continue home meds  #7 mild hypotension -her blood pressure was 93/51 on discharge with a MAP of 64.  She was asymptomatic.  She was able to ambulate without lightheadedness.    Estimated body mass index is 27.9 kg/m as calculated from the following:   Height as of this encounter: 5' (1.524 m).   Weight as of this encounter: 64.8 kg.  Discharge Instructions  Discharge Instructions    Diet - low sodium heart healthy   Complete by: As directed    Increase activity slowly   Complete by: As directed    No wound care   Complete by: As directed      Allergies as of 05/09/2020      Reactions   Shellfish Allergy Anaphylaxis   Reaction to shrimp only   Lidocaine Hives      Medication List    STOP taking these medications   metaxalone 800 MG tablet Commonly known as: SKELAXIN     TAKE these medications   albuterol 108 (90 Base) MCG/ACT inhaler Commonly known as: VENTOLIN HFA Inhale 1-2 puffs into the lungs every 6 (six) hours as needed for wheezing or shortness of breath.   calcitRIOL 0.5 MCG capsule Commonly known as: ROCALTROL Take 1 capsule (0.5 mcg total) by mouth 2 (two) times daily.   Calcium Carb-Cholecalciferol 500-400 MG-UNIT Tabs Commonly known as: Calcium 500 +D Take 2 tablets by mouth 3 (three) times daily.   calcium carbonate 500 MG chewable tablet Commonly known as: Tums Chew 2 tablets (400 mg  of elemental calcium total) by mouth 3 (three) times daily.   diazepam 5 MG tablet Commonly known as: VALIUM Take 5 mg by mouth at bedtime as needed (sleep).   divalproex 500 MG DR tablet Commonly known as: DEPAKOTE Take 500 mg by mouth daily.   esomeprazole 40 MG capsule Commonly known as: NEXIUM Take 40 mg by mouth daily.   estradiol 1 MG tablet Commonly known as: ESTRACE Take  1 mg by mouth daily.   hydrOXYzine 25 MG tablet Commonly known as: ATARAX/VISTARIL Take 25 mg by mouth at bedtime as needed (panic attacks).   levothyroxine 100 MCG tablet Commonly known as: Synthroid Take 1 tablet (100 mcg total) by mouth daily.   magnesium oxide 400 (241.3 Mg) MG tablet Commonly known as: MAG-OX Take 1 tablet (400 mg total) by mouth 2 (two) times daily.   methocarbamol 750 MG tablet Commonly known as: ROBAXIN Take 750 mg by mouth daily.   multivitamin with minerals Tabs tablet Take 1 tablet by mouth daily. Centrum   Ozempic (1 MG/DOSE) 4 MG/3ML Sopn Generic drug: Semaglutide (1 MG/DOSE) Inject 1 mg into the muscle every Wednesday.   QUEtiapine 100 MG tablet Commonly known as: SEROQUEL Take 100 mg by mouth at bedtime.   sertraline 100 MG tablet Commonly known as: ZOLOFT Take 100 mg by mouth at bedtime.   traMADol 50 MG tablet Commonly known as: Ultram Take 1-2 tablets (50-100 mg total) by mouth every 6 (six) hours as needed for moderate pain.   valACYclovir 500 MG tablet Commonly known as: VALTREX Take 500 mg by mouth daily.   Vitamin D3 25 MCG tablet Commonly known as: Vitamin D Take 1 tablet (1,000 Units total) by mouth daily.       Follow-up Information    Merrilee Seashore, MD Follow up.   Specialty: Internal Medicine Why: pl check bmp and mag in 1 week Contact information: [address] [place] [address] [phone]        Elouise Munroe, MD .   Specialties: Cardiology, Radiology Contact information: [address] [place] [address] [phone]              Allergies  Allergen Reactions  . Shellfish Allergy Anaphylaxis    Reaction to shrimp only  . Lidocaine Hives    Consultations:  General surgery Phone consultation with nephrology Procedures/Studies: DG Chest 2 View per protocol  Result Date: 04/24/2020 CLINICAL DATA:  Preoperative EXAM: CHEST - 2 VIEW COMPARISON:   06/13/2019 FINDINGS: The heart size and mediastinal contours are within normal limits. Both lungs are clear. The visualized skeletal structures are unremarkable. IMPRESSION: No acute abnormality of the lungs. Electronically Signed   By: Eddie Candle M.D.   On: 04/24/2020 09:10    (Echo, Carotid, EGD, Colonoscopy, ERCP)    Subjective: Patient resting in bed awake alert anxious to go home no complaints  Discharge Exam: Vitals:   05/08/20 2143 05/09/20 0546  BP: 117/75 (!) 93/51  Pulse: 84 82  Resp: 16 14  Temp: 98.5 F (36.9 C) 98.6 F (37 C)  SpO2: 100% 100%   Vitals:   05/08/20 2141 05/08/20 2142 05/08/20 2143 05/09/20 0546  BP: 99/66 121/75 117/75 (!) 93/51  Pulse: 77 74 84 82  Resp: 14 14 16 14   Temp: 98.1 F (36.7 C) 98.1 F (36.7 C) 98.5 F (36.9 C) 98.6 F (37 C)  TempSrc: Oral Oral Oral Oral  SpO2: 100% 100% 100% 100%  Weight:  Height:        General: Pt is alert, awake, not in acute distress Cardiovascular: RRR, S1/S2 +, no rubs, no gallops Respiratory: CTA bilaterally, no wheezing, no rhonchi Abdominal: Soft, NT, ND, bowel sounds + Extremities: no edema, no cyanosis    The results of significant diagnostics from this hospitalization (including imaging, microbiology, ancillary and laboratory) are listed below for reference.     Microbiology: Recent Results (from the past 240 hour(s))  Resp Panel by RT-PCR (Flu A&B, Covid) Nasopharyngeal Swab     Status: None   Collection Time: 05/03/20 11:49 AM   Specimen: Nasopharyngeal Swab; Nasopharyngeal(NP) swabs in vial transport medium  Result Value Ref Range Status   SARS Coronavirus 2 by RT PCR NEGATIVE NEGATIVE Final    Comment: (NOTE) SARS-CoV-2 target nucleic acids are NOT DETECTED.  The SARS-CoV-2 RNA is generally detectable in upper respiratory specimens during the acute phase of infection. The lowest concentration of SARS-CoV-2 viral copies this assay can detect is 138 copies/mL. A negative result  does not preclude SARS-Cov-2 infection and should not be used as the sole basis for treatment or other patient management decisions. A negative result may occur with  improper specimen collection/handling, submission of specimen other than nasopharyngeal swab, presence of viral mutation(s) within the areas targeted by this assay, and inadequate number of viral copies(<138 copies/mL). A negative result must be combined with clinical observations, patient history, and epidemiological information. The expected result is Negative.  Fact Sheet for Patients:  EntrepreneurPulse.com.au  Fact Sheet for Healthcare Providers:  IncredibleEmployment.be  This test is no t yet approved or cleared by the [place] FDA and  has been authorized for detection and/or diagnosis of SARS-CoV-2 by FDA under an Emergency Use Authorization (EUA). This EUA will remain  in effect (meaning this test can be used) for the duration of the COVID-19 declaration under Section 564(b)(1) of the Act, 21 U.S.C.section 360bbb-3(b)(1), unless the authorization is terminated  or revoked sooner.       Influenza A by PCR NEGATIVE NEGATIVE Final   Influenza B by PCR NEGATIVE NEGATIVE Final    Comment: (NOTE) The Xpert Xpress SARS-CoV-2/FLU/RSV plus assay is intended as an aid in the diagnosis of influenza from Nasopharyngeal swab specimens and should not be used as a sole basis for treatment. Nasal washings and aspirates are unacceptable for Xpert Xpress SARS-CoV-2/FLU/RSV testing.  Fact Sheet for Patients: EntrepreneurPulse.com.au  Fact Sheet for Healthcare Providers: IncredibleEmployment.be  This test is not yet approved or cleared by the [place] FDA and has been authorized for detection and/or diagnosis of SARS-CoV-2 by FDA under an Emergency Use Authorization (EUA). This EUA will remain in effect (meaning this test can be used) for  the duration of the COVID-19 declaration under Section 564(b)(1) of the Act, 21 U.S.C. section 360bbb-3(b)(1), unless the authorization is terminated or revoked.  Performed at [facility], [place] [address] [place], [place] [address]   Culture, blood (routine x 2)     Status: None (Preliminary result)   Collection Time: 05/05/20 12:58 PM   Specimen: BLOOD  Result Value Ref Range Status   Specimen Description   Final    BLOOD LEFT ANTECUBITAL Performed at [facility] [address], [place], [place] [address]    Special Requests   Final    BOTTLES DRAWN AEROBIC ONLY Blood Culture adequate volume Performed at [facility] [address], [place], [place] [address]    Culture   Final  NO GROWTH 4 DAYS Performed at [facility], [place] [address], [place], [place] [address]    Report Status PENDING  Incomplete  Culture, blood (routine x 2)     Status: None (Preliminary result)   Collection Time: 05/05/20 12:58 PM   Specimen: BLOOD  Result Value Ref Range Status   Specimen Description   Final    BLOOD RIGHT ANTECUBITAL Performed at [facility] [address] [place], [place] [address]    Special Requests   Final    BOTTLES DRAWN AEROBIC ONLY Blood Culture adequate volume Performed at [facility] [address] [place], [place] [address]    Culture   Final    NO GROWTH 4 DAYS Performed at [facility], [place] [address], [place], [place] [address]    Report Status PENDING  Incomplete     Labs: BNP (last 3 results) No results for input(s): BNP in the last 8760 hours. Basic Metabolic Panel: Recent Labs  Lab 05/05/20 1258 05/05/20 2045 05/05/20 2047 05/06/20 0655 05/06/20 1409 05/06/20 1836 05/07/20 0535 05/08/20 0038 05/08/20 1050 05/09/20 0541  NA 140  --  141 137  --   --  138 137 143 139  K 3.9  --  5.0 3.7  --   --  3.6 3.8 3.8 3.6  CL 103  --  100  103  --   --  98 100 103 102  CO2 28  --  27 26  --   --  28 26 29 28   GLUCOSE 85  --  92 81  --   --  83 87 82 87  BUN 11  --  13 14  --   --  19 17 12 12   CREATININE 0.69  --  0.76 0.76  --   --  0.90 0.64 0.66 0.88  CALCIUM 6.7*  6.5   < > 6.6* 6.5* 6.8* 7.7* 7.9* 7.3* 9.1 8.3*  MG 1.7  --  1.7  --  1.5* 1.7 2.0  --  1.7  --   PHOS 4.1  --   --   --   --   --   --   --   --   --    < > = values in this interval not displayed.   Liver Function Tests: Recent Labs  Lab 05/06/20 0655 05/07/20 0535 05/08/20 0038 05/08/20 1050 05/09/20 0541  AST 27 24 20 25 21   ALT 33 29 26 30 26   ALKPHOS 50 55 43 51 43  BILITOT 0.5 0.7 0.6 0.5 0.5  PROT 5.9* 6.5 5.9* 7.1 6.4*  ALBUMIN 2.9* 3.2* 3.0* 3.5 3.1*   No results for input(s): LIPASE, AMYLASE in the last 168 hours. No results for input(s): AMMONIA in the last 168 hours. CBC: Recent Labs  Lab 05/06/20 0834 05/07/20 0535 05/08/20 0038 05/08/20 1050 05/09/20 0541  WBC 7.9 8.9 10.2 8.0 9.7  NEUTROABS 4.1 4.4 4.8 4.7 5.8  HGB 10.6* 9.9* 9.0* 10.6* 9.5*  HCT 32.1* 30.2* 28.0* 32.2* 29.2*  MCV 90.7 89.9 90.9 90.7 91.5  PLT 266 271 254 275 262   Cardiac Enzymes: Recent Labs  Lab 05/03/20 1014  CKTOTAL 199   BNP: Invalid input(s): POCBNP CBG: Recent Labs  Lab 05/08/20 0739 05/08/20 1142 05/08/20 1654 05/08/20 2139 05/09/20 0731  GLUCAP 79 82 93 76 78   D-Dimer No results for input(s): DDIMER in the last 72 hours. Hgb A1c No results for  input(s): HGBA1C in the last 72 hours. Lipid Profile No results for input(s): CHOL, HDL, LDLCALC, TRIG, CHOLHDL, LDLDIRECT in the last 72 hours. Thyroid function studies No results for input(s): TSH, T4TOTAL, T3FREE, THYROIDAB in the last 72 hours.  Invalid input(s): FREET3 Anemia work up No results for input(s): VITAMINB12, FOLATE, FERRITIN, TIBC, IRON, RETICCTPCT in the last 72 hours. Urinalysis    Component Value Date/Time   COLORURINE YELLOW 05/03/2020 1014   APPEARANCEUR  CLEAR 05/03/2020 1014   LABSPEC 1.014 05/03/2020 1014   PHURINE 5.0 05/03/2020 1014   GLUCOSEU NEGATIVE 05/03/2020 1014   HGBUR NEGATIVE 05/03/2020 1014   [place] 05/03/2020 1014   KETONESUR 20 (A) 05/03/2020 1014   PROTEINUR NEGATIVE 05/03/2020 1014   NITRITE NEGATIVE 05/03/2020 1014   LEUKOCYTESUR NEGATIVE 05/03/2020 1014   Sepsis Labs Invalid input(s): PROCALCITONIN,  WBC,  LACTICIDVEN Microbiology Recent Results (from the past 240 hour(s))  Resp Panel by RT-PCR (Flu A&B, Covid) Nasopharyngeal Swab     Status: None   Collection Time: 05/03/20 11:49 AM   Specimen: Nasopharyngeal Swab; Nasopharyngeal(NP) swabs in vial transport medium  Result Value Ref Range Status   SARS Coronavirus 2 by RT PCR NEGATIVE NEGATIVE Final    Comment: (NOTE) SARS-CoV-2 target nucleic acids are NOT DETECTED.  The SARS-CoV-2 RNA is generally detectable in upper respiratory specimens during the acute phase of infection. The lowest concentration of SARS-CoV-2 viral copies this assay can detect is 138 copies/mL. A negative result does not preclude SARS-Cov-2 infection and should not be used as the sole basis for treatment or other patient management decisions. A negative result may occur with  improper specimen collection/handling, submission of specimen other than nasopharyngeal swab, presence of viral mutation(s) within the areas targeted by this assay, and inadequate number of viral copies(<138 copies/mL). A negative result must be combined with clinical observations, patient history, and epidemiological information. The expected result is Negative.  Fact Sheet for Patients:  EntrepreneurPulse.com.au  Fact Sheet for Healthcare Providers:  IncredibleEmployment.be  This test is no t yet approved or cleared by the [place] FDA and  has been authorized for detection and/or diagnosis of SARS-CoV-2 by FDA under an Emergency Use Authorization (EUA).  This EUA will remain  in effect (meaning this test can be used) for the duration of the COVID-19 declaration under Section 564(b)(1) of the Act, 21 U.S.C.section 360bbb-3(b)(1), unless the authorization is terminated  or revoked sooner.       Influenza A by PCR NEGATIVE NEGATIVE Final   Influenza B by PCR NEGATIVE NEGATIVE Final    Comment: (NOTE) The Xpert Xpress SARS-CoV-2/FLU/RSV plus assay is intended as an aid in the diagnosis of influenza from Nasopharyngeal swab specimens and should not be used as a sole basis for treatment. Nasal washings and aspirates are unacceptable for Xpert Xpress SARS-CoV-2/FLU/RSV testing.  Fact Sheet for Patients: EntrepreneurPulse.com.au  Fact Sheet for Healthcare Providers: IncredibleEmployment.be  This test is not yet approved or cleared by the [place] FDA and has been authorized for detection and/or diagnosis of SARS-CoV-2 by FDA under an Emergency Use Authorization (EUA). This EUA will remain in effect (meaning this test can be used) for the duration of the COVID-19 declaration under Section 564(b)(1) of the Act, 21 U.S.C. section 360bbb-3(b)(1), unless the authorization is terminated or revoked.  Performed at [facility], [place] [address] [place], [place] [address]   Culture, blood (routine x 2)     Status: None (Preliminary result)   Collection Time: 05/05/20  12:58 PM   Specimen: BLOOD  Result Value Ref Range Status   Specimen Description   Final    BLOOD LEFT ANTECUBITAL Performed at [facility] [address] [place], [place] [address]    Special Requests   Final    BOTTLES DRAWN AEROBIC ONLY Blood Culture adequate volume Performed at [facility] [address], [place], [place] [address]    Culture   Final    NO GROWTH 4 DAYS Performed at [facility], [place] [address], [place], [place] [address]    Report Status  PENDING  Incomplete  Culture, blood (routine x 2)     Status: None (Preliminary result)   Collection Time: 05/05/20 12:58 PM   Specimen: BLOOD  Result Value Ref Range Status   Specimen Description   Final    BLOOD RIGHT ANTECUBITAL Performed at [place] [facility] [address], [place], [place] [address]    Special Requests   Final    BOTTLES DRAWN AEROBIC ONLY Blood Culture adequate volume Performed at [place] [address], [place], [place] [address]    Culture   Final    NO GROWTH 4 DAYS Performed at [facility], [place] [address], [place], [place] [address]    Report Status PENDING  Incomplete     Time coordinating discharge:  38 minutes  SIGNED:   Georgette Shell, MD  [facility] 05/09/2020, 9:10 AM

## 2020-05-10 LAB — CULTURE, BLOOD (ROUTINE X 2)
Culture: NO GROWTH
Culture: NO GROWTH
Special Requests: ADEQUATE
Special Requests: ADEQUATE

## 2020-05-14 ENCOUNTER — Ambulatory Visit: Payer: Medicare Other | Admitting: Internal Medicine

## 2021-01-13 IMAGING — CR CHEST - 2 VIEW
2 series · 2 of 2 positions shown · non-contrast
Comparison: None.

CLINICAL DATA: Chest pain and shortness of breath

EXAM:
CHEST - 2 VIEW

[chest pa]
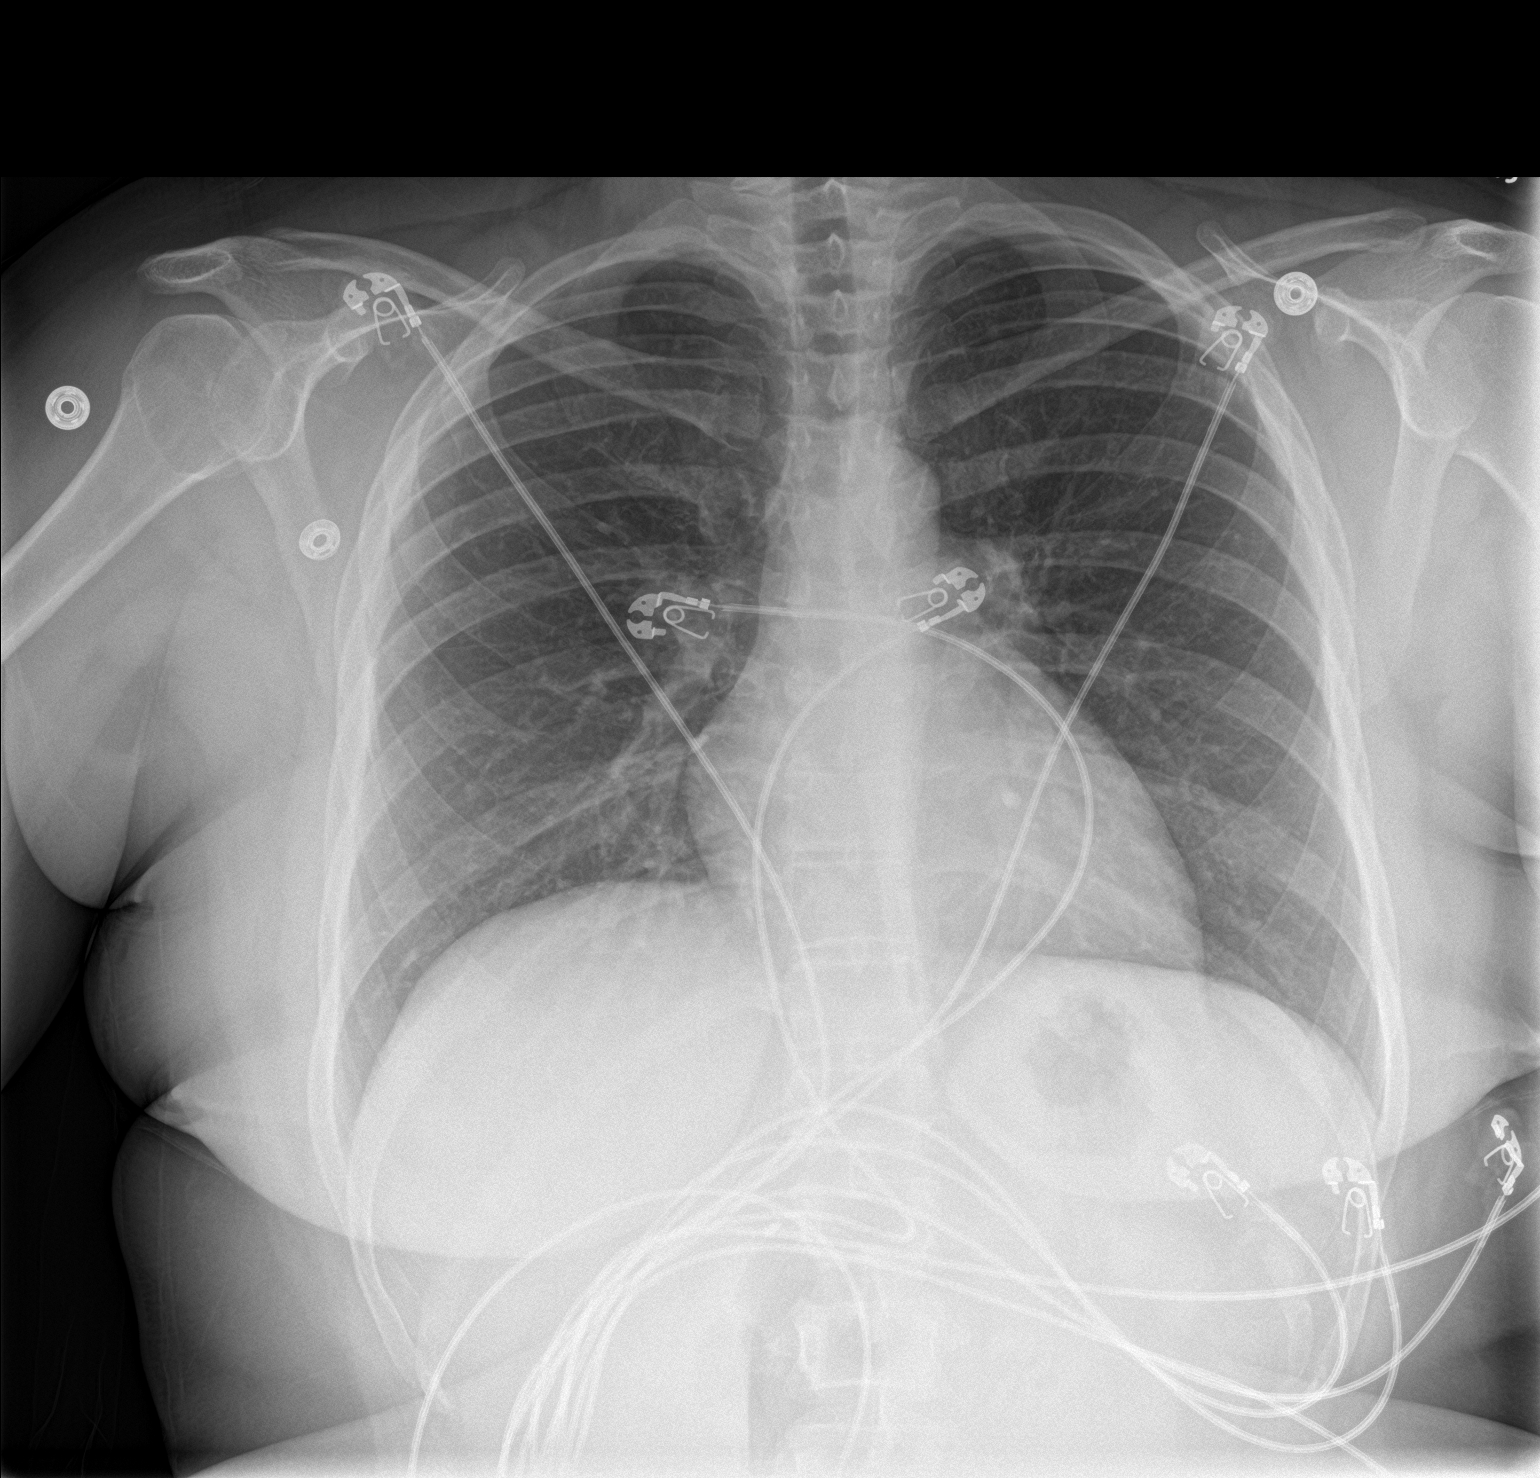

[chest lat]
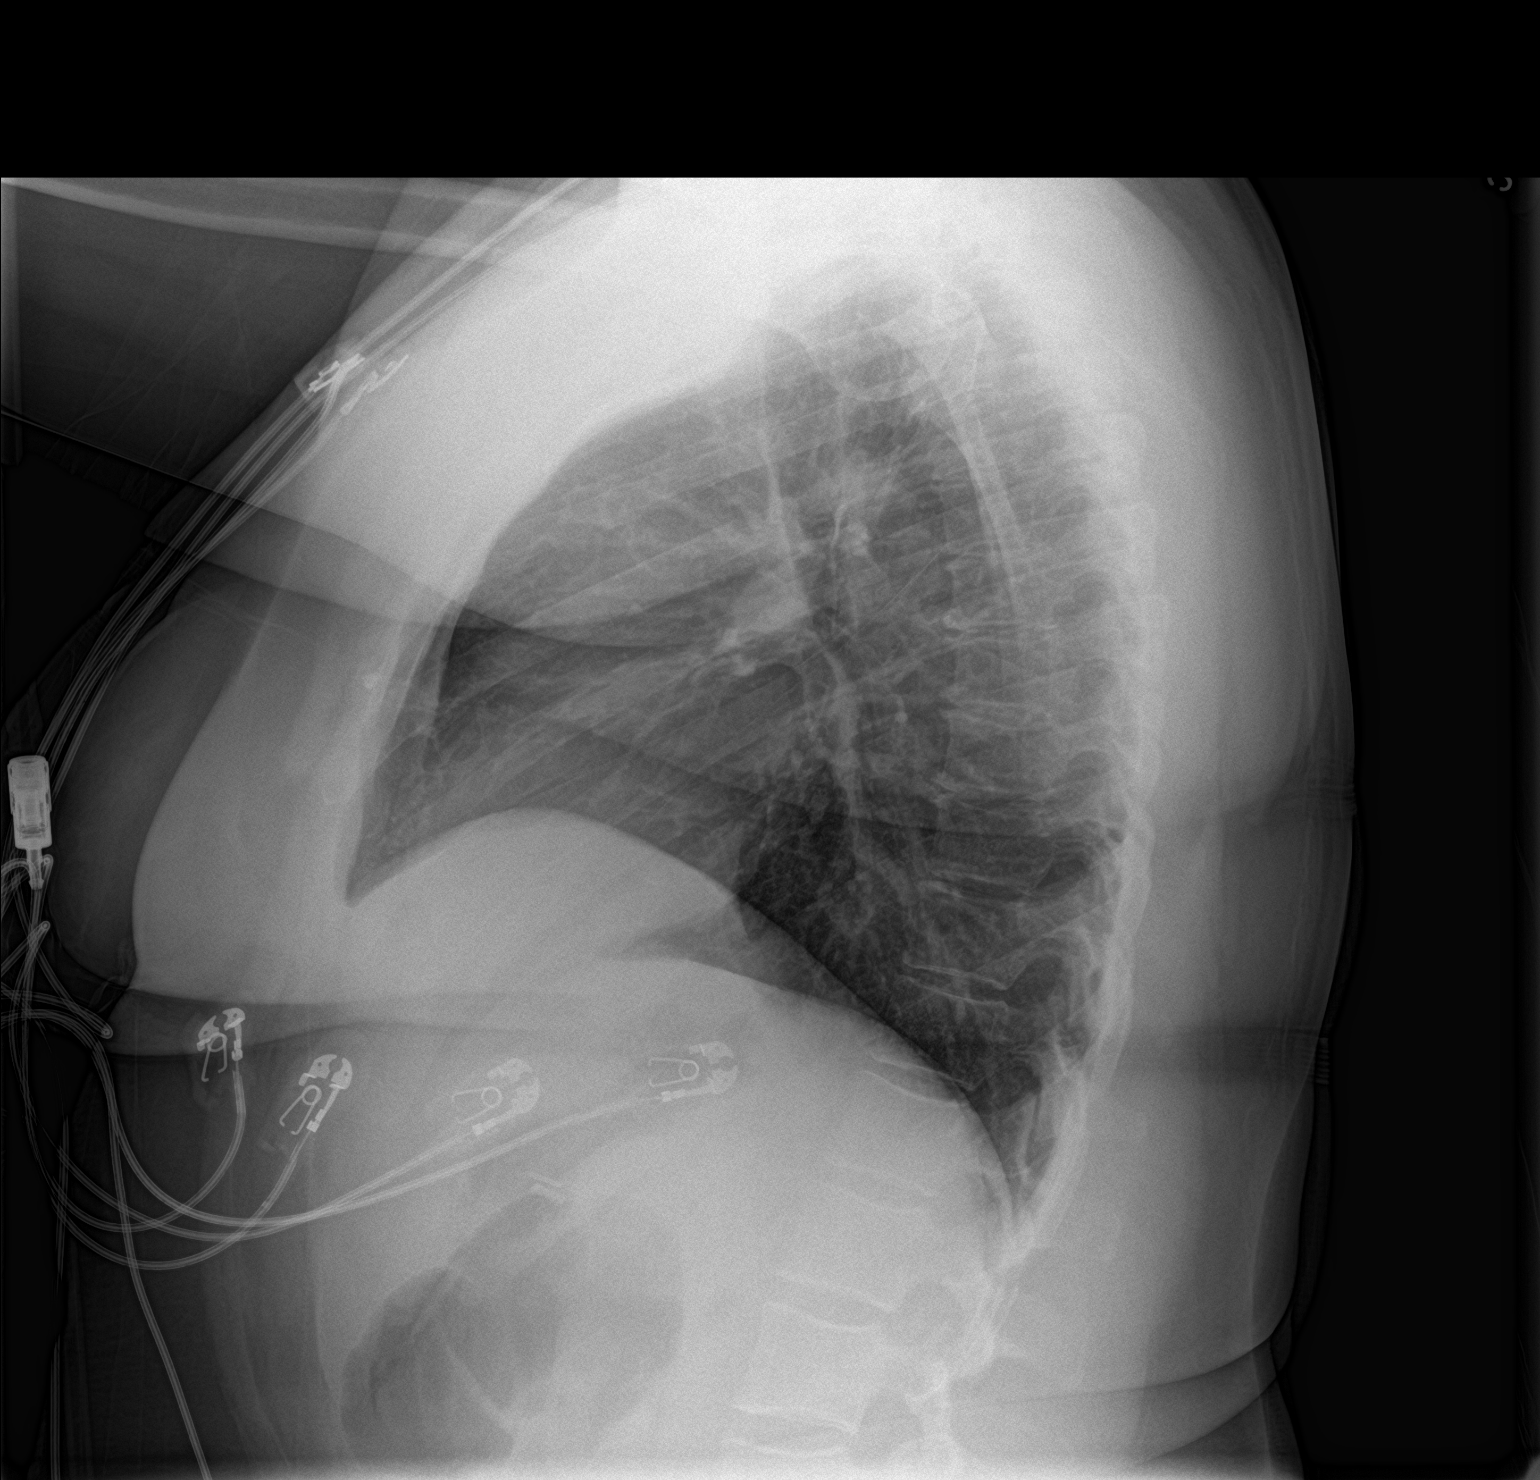

[2 of 2 positions shown; findings below may reference images not displayed]

FINDINGS: Lungs are clear. Heart size and pulmonary vascularity are normal. No
adenopathy. No bone lesions. No pneumothorax.
IMPRESSION: No edema or consolidation.

## 2022-08-19 IMAGING — CR DG CHEST 2V
2 series · 2 of 2 positions shown · non-contrast
Comparison: 06/13/2019

CLINICAL DATA: Preoperative

EXAM:
CHEST - 2 VIEW

[w chest pa]
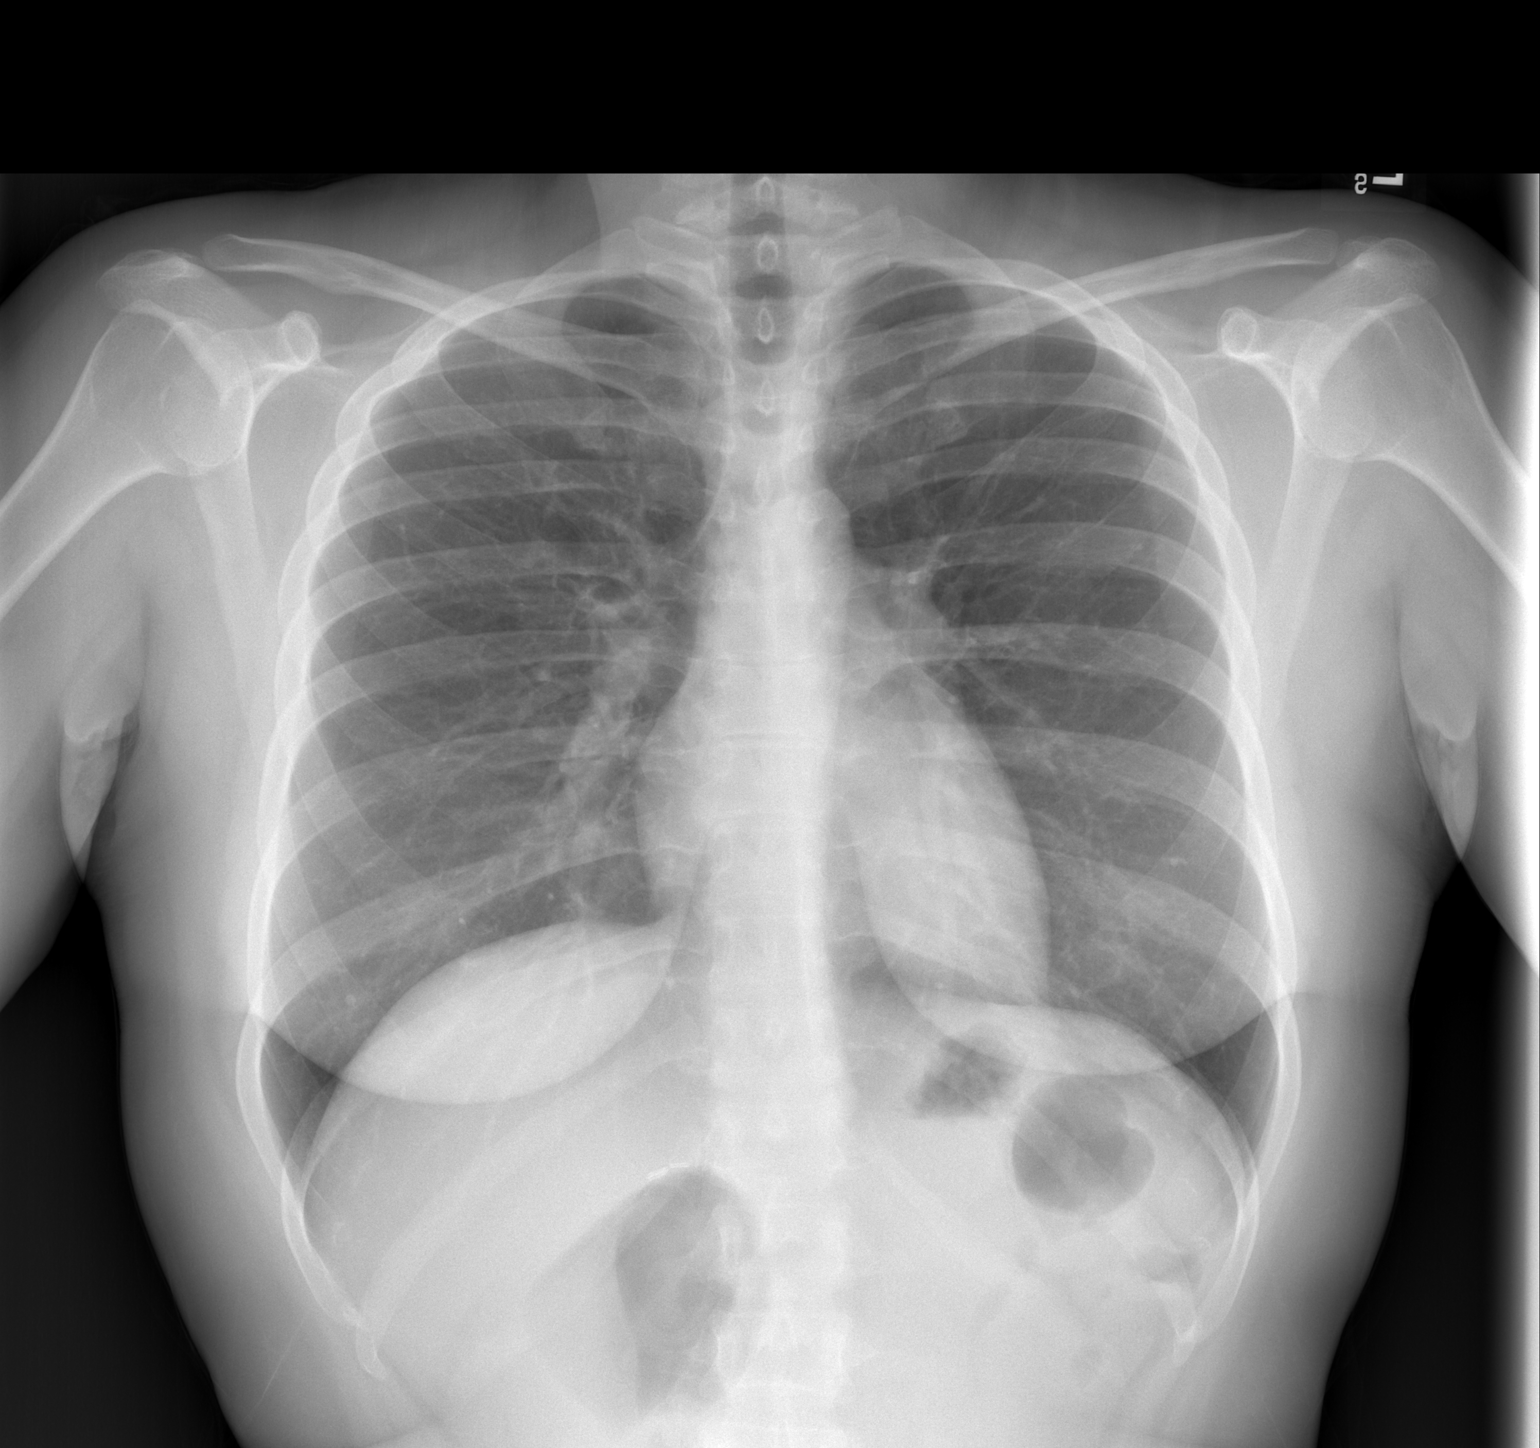

[w chest lat]
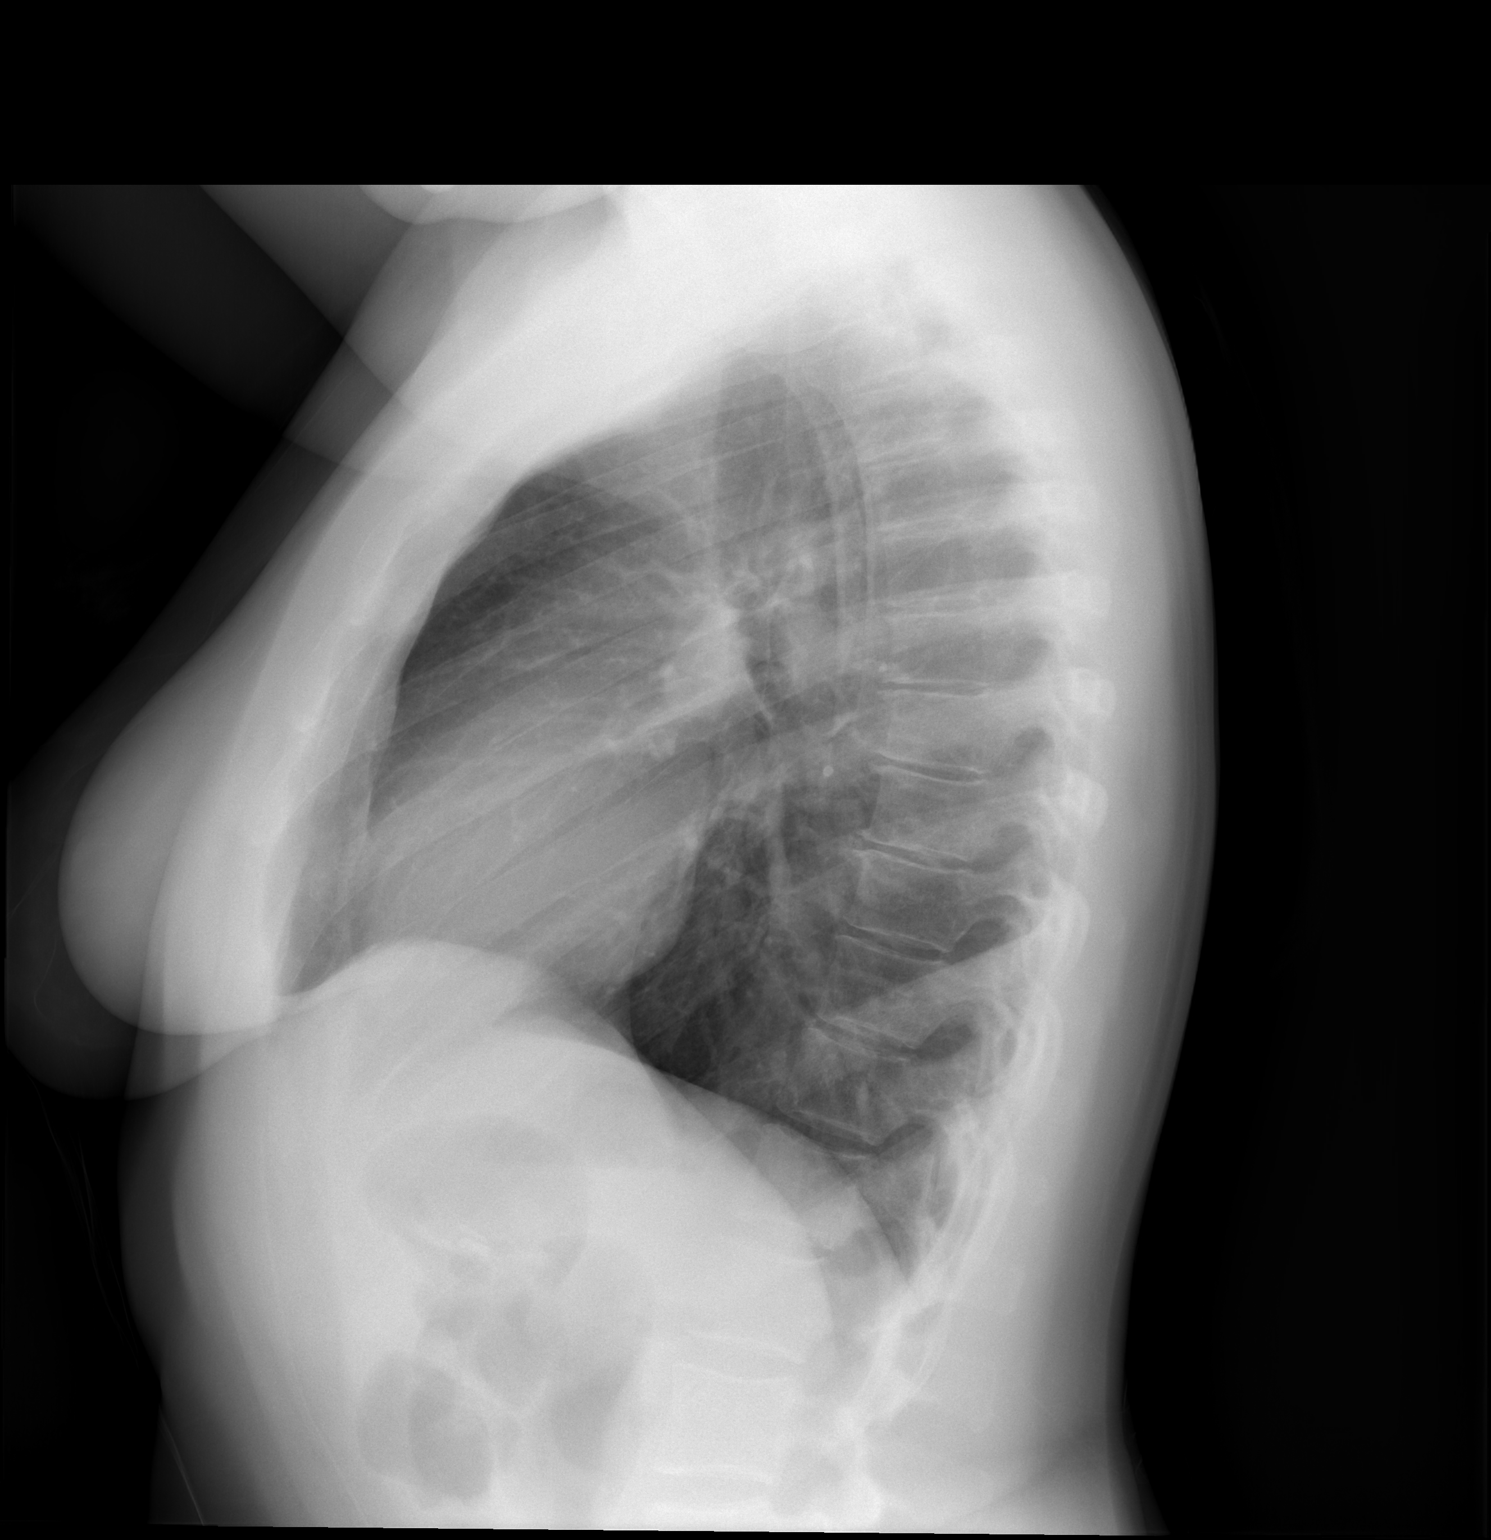

[2 of 2 positions shown; findings below may reference images not displayed]

FINDINGS: The heart size and mediastinal contours are within normal limits.
Both lungs are clear. The visualized skeletal structures are
unremarkable.
IMPRESSION: No acute abnormality of the lungs.
